# Patient Record
Sex: Male | Born: 1985
Health system: Southern US, Community
[De-identification: ages and names within clinical notes are randomized; demographics above are authoritative.]

## PROBLEM LIST (undated history)

## (undated) ENCOUNTER — Ambulatory Visit (HOSPITAL_COMMUNITY): Admission: EM | Payer: Commercial Managed Care - PPO | Source: Home / Self Care

## (undated) DIAGNOSIS — I10 Essential (primary) hypertension: Secondary | ICD-10-CM

## (undated) DIAGNOSIS — E119 Type 2 diabetes mellitus without complications: Secondary | ICD-10-CM

## (undated) DIAGNOSIS — J45909 Unspecified asthma, uncomplicated: Secondary | ICD-10-CM

## (undated) DIAGNOSIS — W3400XA Accidental discharge from unspecified firearms or gun, initial encounter: Secondary | ICD-10-CM

## (undated) DIAGNOSIS — S21339A Puncture wound without foreign body of unspecified front wall of thorax with penetration into thoracic cavity, initial encounter: Secondary | ICD-10-CM

## (undated) HISTORY — PX: CHEST SURGERY: SHX595

---

## 2004-06-13 ENCOUNTER — Emergency Department (HOSPITAL_COMMUNITY): Admission: EM | Admit: 2004-06-13 | Discharge: 2004-06-13 | Payer: Self-pay | Admitting: Emergency Medicine

## 2005-03-08 ENCOUNTER — Observation Stay (HOSPITAL_COMMUNITY): Admission: EM | Admit: 2005-03-08 | Discharge: 2005-03-08 | Payer: Self-pay | Admitting: Emergency Medicine

## 2006-05-06 ENCOUNTER — Emergency Department (HOSPITAL_COMMUNITY): Admission: EM | Admit: 2006-05-06 | Discharge: 2006-05-06 | Payer: Self-pay

## 2007-04-27 ENCOUNTER — Emergency Department (HOSPITAL_COMMUNITY): Admission: EM | Admit: 2007-04-27 | Discharge: 2007-04-27 | Payer: Self-pay | Admitting: Emergency Medicine

## 2008-01-30 ENCOUNTER — Inpatient Hospital Stay (HOSPITAL_COMMUNITY): Admission: EM | Admit: 2008-01-30 | Discharge: 2008-02-15 | Payer: Self-pay | Admitting: Family Medicine

## 2008-01-30 ENCOUNTER — Ambulatory Visit: Payer: Self-pay | Admitting: Pulmonary Disease

## 2008-02-01 ENCOUNTER — Ambulatory Visit: Payer: Self-pay | Admitting: Cardiothoracic Surgery

## 2008-02-01 ENCOUNTER — Encounter: Payer: Self-pay | Admitting: Cardiothoracic Surgery

## 2008-02-03 ENCOUNTER — Ambulatory Visit: Payer: Self-pay | Admitting: Infectious Diseases

## 2008-02-04 ENCOUNTER — Encounter: Payer: Self-pay | Admitting: Cardiothoracic Surgery

## 2008-12-25 ENCOUNTER — Emergency Department (HOSPITAL_COMMUNITY): Admission: EM | Admit: 2008-12-25 | Discharge: 2008-12-25 | Payer: Self-pay | Admitting: Family Medicine

## 2008-12-27 ENCOUNTER — Emergency Department (HOSPITAL_COMMUNITY): Admission: EM | Admit: 2008-12-27 | Discharge: 2008-12-27 | Payer: Self-pay | Admitting: Emergency Medicine

## 2009-01-17 ENCOUNTER — Emergency Department (HOSPITAL_COMMUNITY): Admission: EM | Admit: 2009-01-17 | Discharge: 2009-01-17 | Payer: Self-pay | Admitting: Family Medicine

## 2009-01-25 ENCOUNTER — Emergency Department (HOSPITAL_COMMUNITY): Admission: EM | Admit: 2009-01-25 | Discharge: 2009-01-25 | Payer: Self-pay | Admitting: Emergency Medicine

## 2009-02-15 ENCOUNTER — Emergency Department (HOSPITAL_COMMUNITY): Admission: EM | Admit: 2009-02-15 | Discharge: 2009-02-15 | Payer: Self-pay | Admitting: Emergency Medicine

## 2009-02-21 ENCOUNTER — Emergency Department (HOSPITAL_COMMUNITY): Admission: EM | Admit: 2009-02-21 | Discharge: 2009-02-22 | Payer: Self-pay | Admitting: Emergency Medicine

## 2009-05-15 ENCOUNTER — Emergency Department (HOSPITAL_COMMUNITY): Admission: EM | Admit: 2009-05-15 | Discharge: 2009-05-15 | Payer: Self-pay | Admitting: Emergency Medicine

## 2009-05-28 ENCOUNTER — Emergency Department (HOSPITAL_COMMUNITY): Admission: EM | Admit: 2009-05-28 | Discharge: 2009-05-28 | Payer: Self-pay | Admitting: Family Medicine

## 2009-08-13 IMAGING — CR DG CHEST 2V
2 series · 2 of 2 positions shown · non-contrast
Comparison: 01/17/2009

CLINICAL DATA: Back and chest pain.

CHEST - 2 VIEW

[w chest pa]
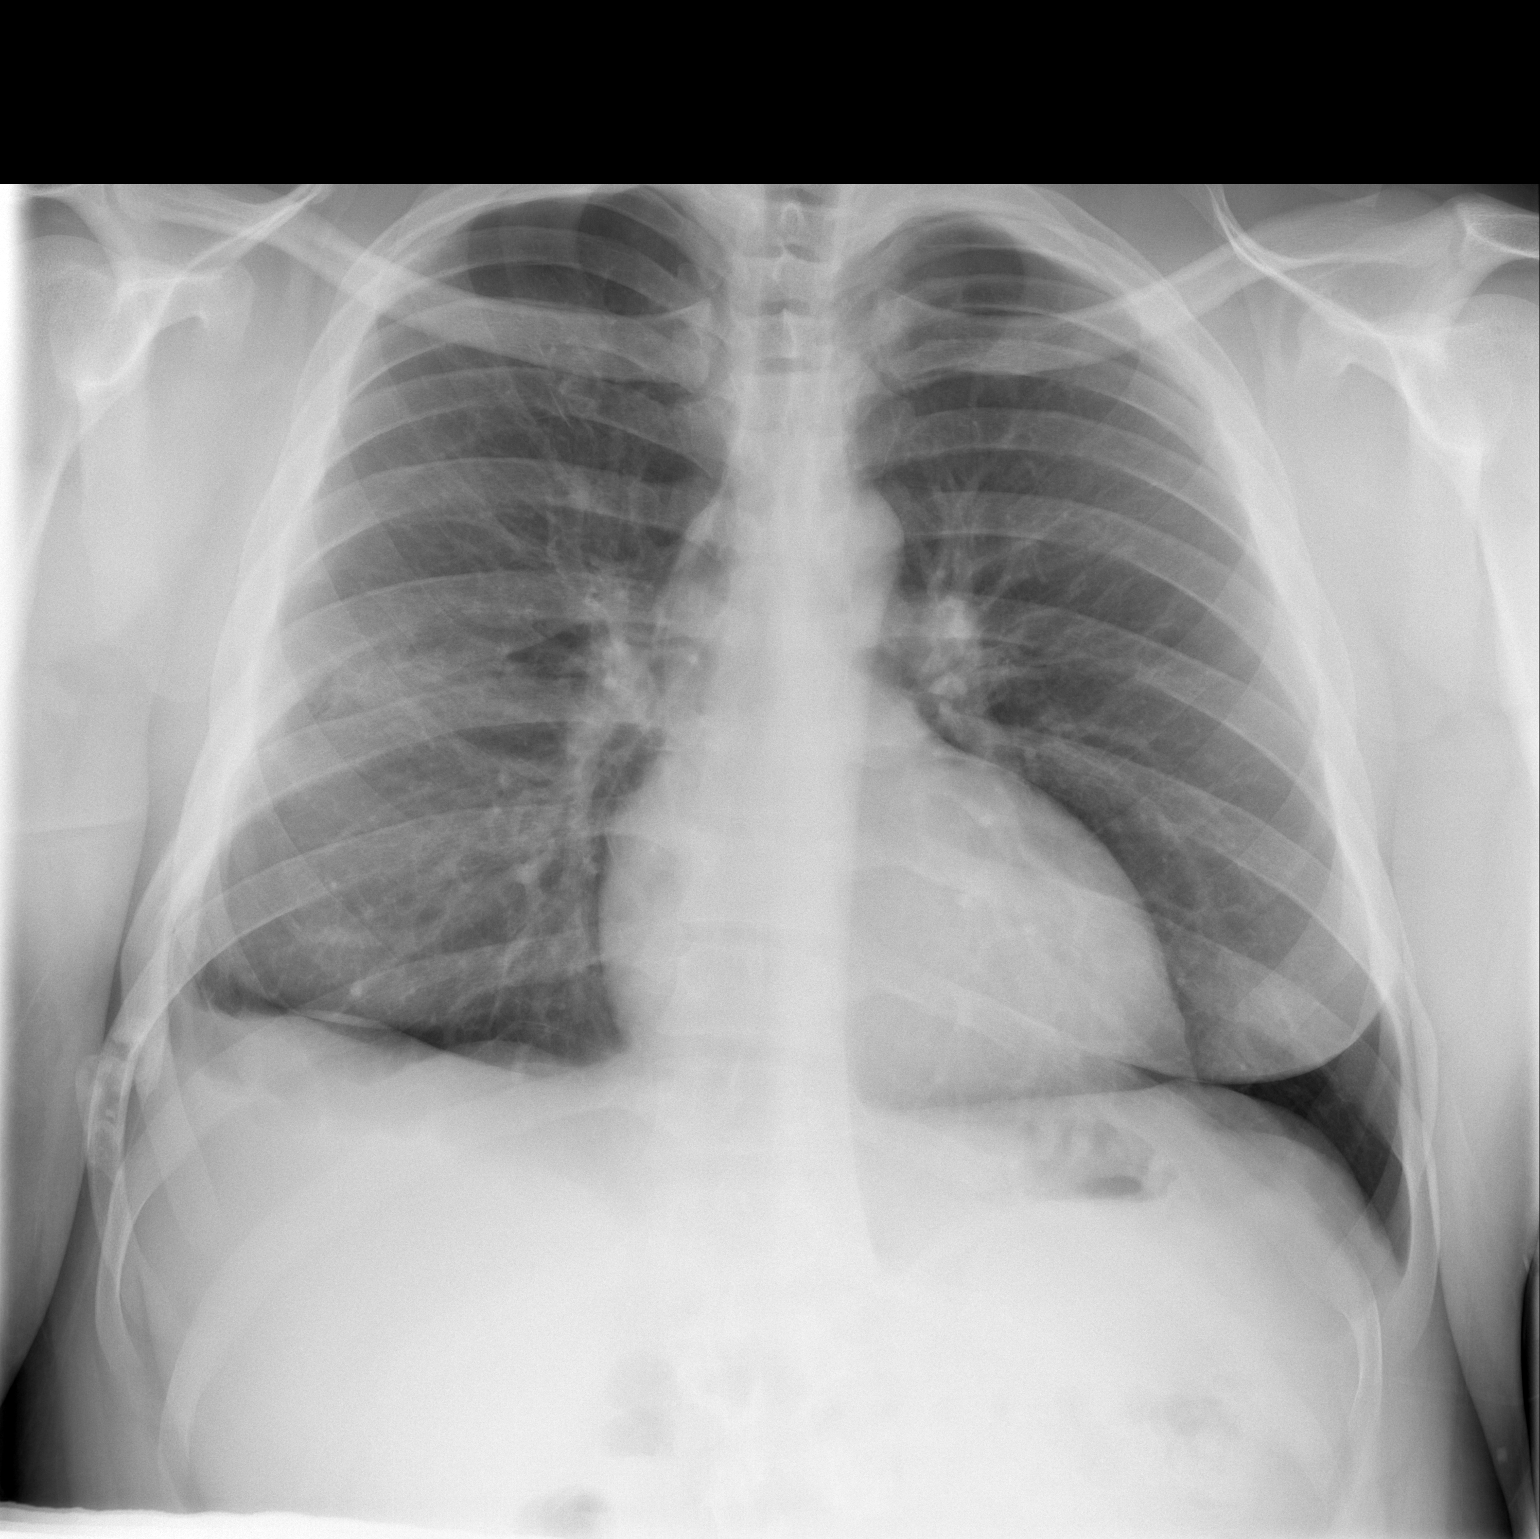

[w chest lat]
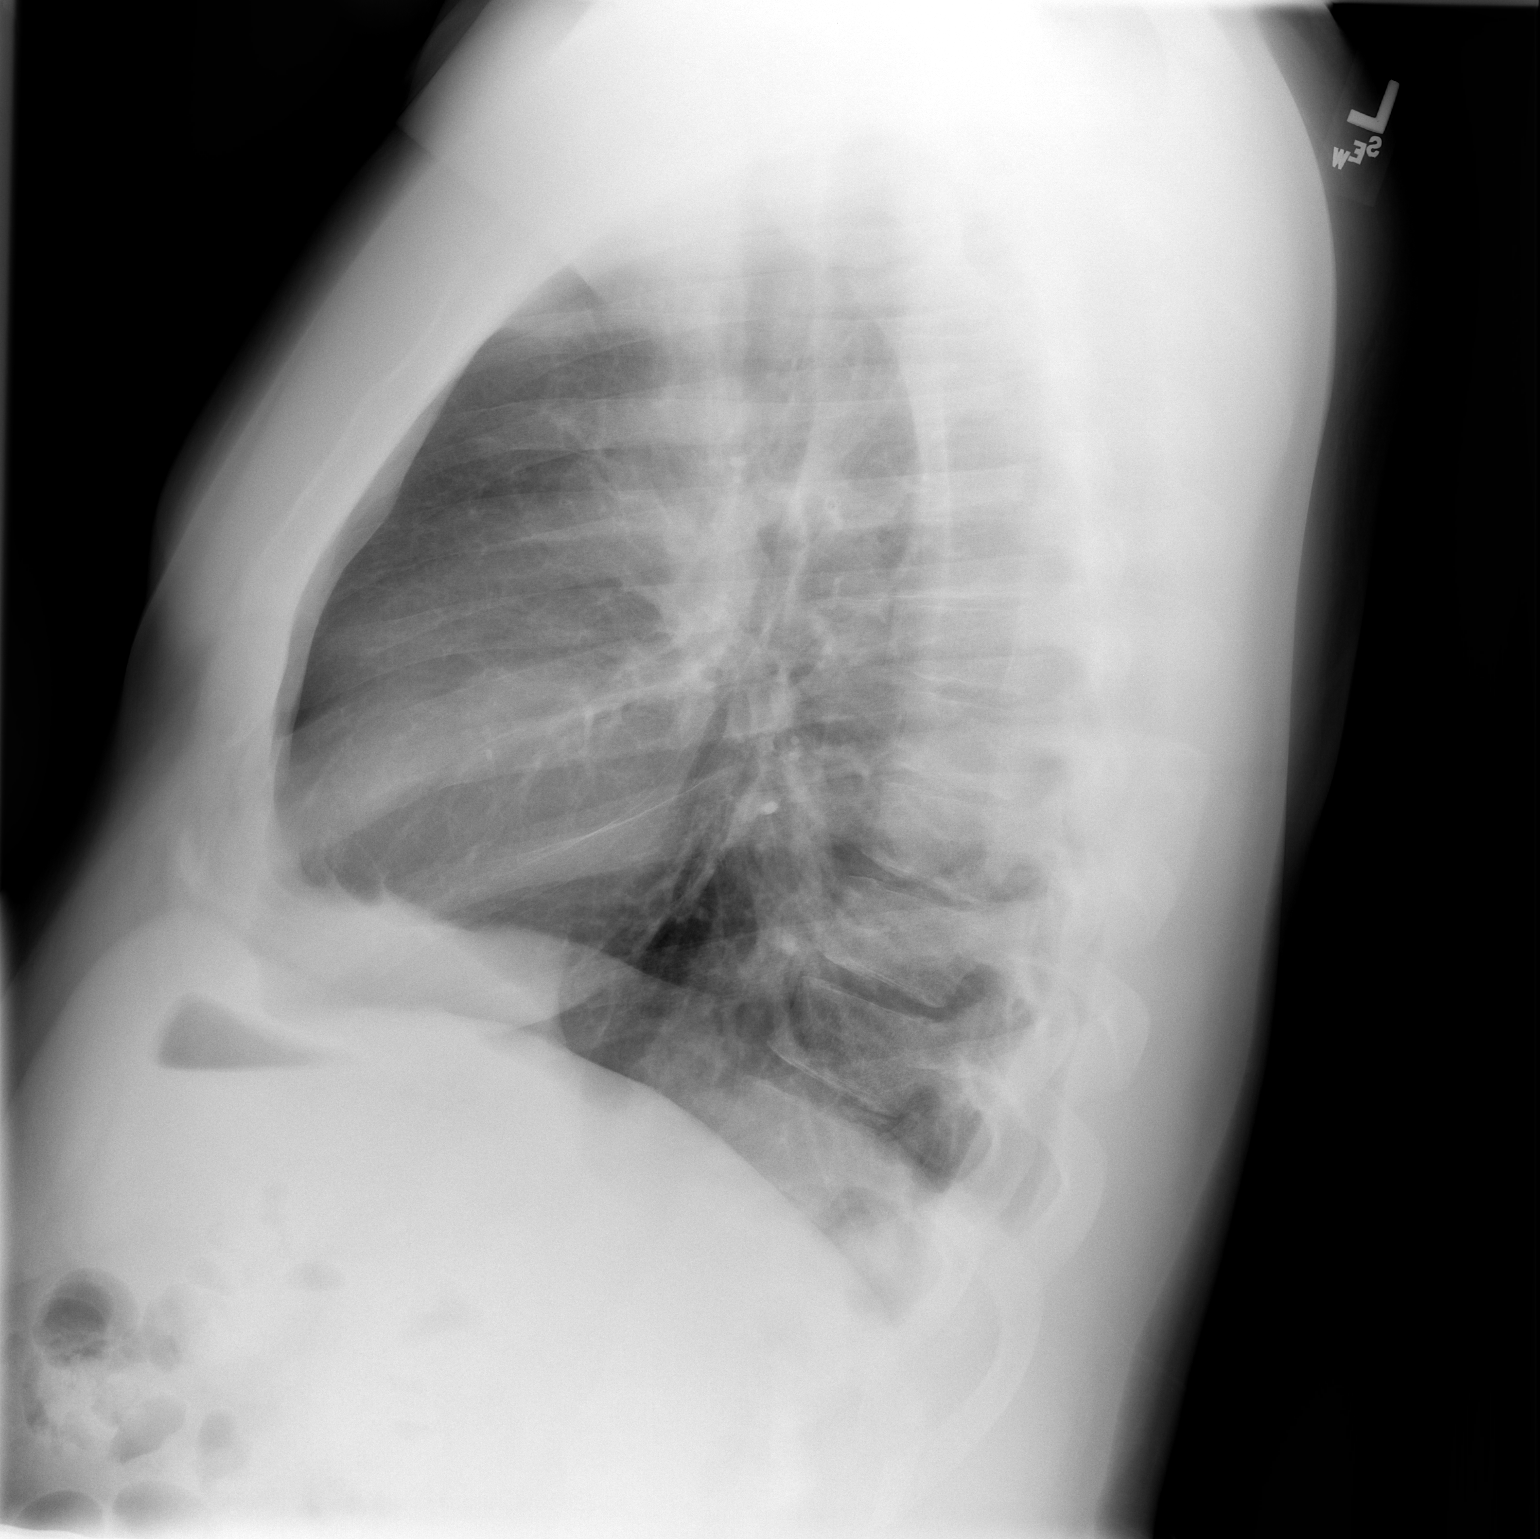

[2 of 2 positions shown; findings below may reference images not displayed]

FINDINGS: The cardiac silhouette, mediastinal and hilar contours
are within normal limits and stable.  The lungs are clear of acute
process.  There is chronic right-sided pleural thickening and
parenchymal scarring change with remote rib fractures.
IMPRESSION: Chronic pleural and parenchymal scarring change on the right.  No
acute pulmonary findings.

## 2010-07-06 ENCOUNTER — Emergency Department (HOSPITAL_COMMUNITY): Admission: EM | Admit: 2010-07-06 | Discharge: 2010-07-06 | Payer: Self-pay | Admitting: Emergency Medicine

## 2010-12-22 ENCOUNTER — Encounter: Payer: Self-pay | Admitting: Cardiothoracic Surgery

## 2011-02-14 LAB — POCT I-STAT, CHEM 8
BUN: 18 mg/dL (ref 6–23)
Calcium, Ion: 1.02 mmol/L — ABNORMAL LOW (ref 1.12–1.32)
HCT: 45 % (ref 39.0–52.0)
Sodium: 140 mEq/L (ref 135–145)
TCO2: 21 mmol/L (ref 0–100)

## 2011-03-10 LAB — POCT RAPID STREP A (OFFICE): Streptococcus, Group A Screen (Direct): NEGATIVE

## 2011-03-17 LAB — CBC
Hemoglobin: 13.5 g/dL (ref 13.0–17.0)
MCHC: 34.6 g/dL (ref 30.0–36.0)
RBC: 4.2 MIL/uL — ABNORMAL LOW (ref 4.22–5.81)
WBC: 9.6 10*3/uL (ref 4.0–10.5)

## 2011-03-17 LAB — BASIC METABOLIC PANEL
Calcium: 9.2 mg/dL (ref 8.4–10.5)
Creatinine, Ser: 1.01 mg/dL (ref 0.4–1.5)
GFR calc Af Amer: >60 mL/min (ref >60)
GFR calc non Af Amer: >60 mL/min (ref >60)
Sodium: 138 mEq/L (ref 135–145)

## 2011-03-17 LAB — POCT URINALYSIS DIP (DEVICE)
Glucose, UA: NEGATIVE mg/dL
Nitrite: NEGATIVE
Protein, ur: 30 mg/dL — AB
Specific Gravity, Urine: 1.025 (ref 1.005–1.030)
Urobilinogen, UA: 1 mg/dL (ref 0.0–1.0)

## 2011-03-17 LAB — DIFFERENTIAL
Basophils Relative: 1 % (ref 0–1)
Lymphocytes Relative: 36 % (ref 12–46)
Lymphs Abs: 3.5 10*3/uL (ref 0.7–4.0)
Monocytes Absolute: 0.6 10*3/uL (ref 0.1–1.0)
Monocytes Relative: 7 % (ref 3–12)
Neutro Abs: 5 10*3/uL (ref 1.7–7.7)
Neutrophils Relative %: 53 % (ref 43–77)

## 2011-03-17 LAB — URINALYSIS, ROUTINE W REFLEX MICROSCOPIC
Hgb urine dipstick: NEGATIVE
Specific Gravity, Urine: 1.026 (ref 1.005–1.030)
Urobilinogen, UA: 1 mg/dL (ref 0.0–1.0)
pH: 6 (ref 5.0–8.0)
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
## 2011-08-25 LAB — FUNGUS CULTURE W SMEAR: Fungal Smear: NONE SEEN

## 2011-08-25 LAB — CBC
HCT: 19.4 — ABNORMAL LOW
HCT: 21.8 — ABNORMAL LOW
HCT: 22.6 — ABNORMAL LOW
HCT: 29.7 — ABNORMAL LOW
HCT: 30 — ABNORMAL LOW
HCT: 30.8 — ABNORMAL LOW
HCT: 31.8 — ABNORMAL LOW
HCT: 32.6 — ABNORMAL LOW
HCT: 26.3 — ABNORMAL LOW
Hemoglobin: 10.3 — ABNORMAL LOW
Hemoglobin: 10.6 — ABNORMAL LOW
Hemoglobin: 11.1 — ABNORMAL LOW
Hemoglobin: 11.2 — ABNORMAL LOW
Hemoglobin: 11.5 — ABNORMAL LOW
Hemoglobin: 14
Hemoglobin: 6.5 — CL
Hemoglobin: 7.4 — CL
Hemoglobin: 7.7 — CL
Hemoglobin: 9.2 — ABNORMAL LOW
MCHC: 33.6
MCHC: 34
MCHC: 34.2
MCHC: 34.5
MCHC: 34.5
MCHC: 34.5
MCHC: 34.2
MCV: 91.2
MCV: 92
MCV: 92.8
MCV: 92.9
MCV: 92.9
MCV: 94.1
MCV: 94.3
Platelets: 264
Platelets: 327
Platelets: 337
Platelets: 339
Platelets: 361
Platelets: 372
Platelets: 544 — ABNORMAL HIGH
RBC: 2.44 — ABNORMAL LOW
RBC: 2.95 — ABNORMAL LOW
RBC: 3.35 — ABNORMAL LOW
RBC: 3.51 — ABNORMAL LOW
RBC: 3.58 — ABNORMAL LOW
RBC: 4.37
RBC: 2.9 — ABNORMAL LOW
RBC: 2.99 — ABNORMAL LOW
RDW: 13.7
RDW: 14.8
RDW: 15
RDW: 15.2
RDW: 15.9 — ABNORMAL HIGH
RDW: 15.9 — ABNORMAL HIGH
RDW: 16.1 — ABNORMAL HIGH
RDW: 15.1
WBC: 10.2
WBC: 13.2 — ABNORMAL HIGH
WBC: 13.8 — ABNORMAL HIGH
WBC: 14.6 — ABNORMAL HIGH
WBC: 15.5 — ABNORMAL HIGH
WBC: 17.1 — ABNORMAL HIGH
WBC: 17.8 — ABNORMAL HIGH
WBC: 23.7 — ABNORMAL HIGH

## 2011-08-25 LAB — BASIC METABOLIC PANEL
BUN: 15
BUN: 16
BUN: 18
BUN: 29 — ABNORMAL HIGH
BUN: 5 — ABNORMAL LOW
BUN: 7
CO2: 23
CO2: 25
CO2: 25
CO2: 26
CO2: 28
CO2: 28
CO2: 36 — ABNORMAL HIGH
Calcium: 8.3 — ABNORMAL LOW
Calcium: 8.4
Calcium: 8.7
Calcium: 8.7
Calcium: 8.8
Calcium: 8.8
Calcium: 8.9
Calcium: 9
Calcium: 9.1
Calcium: 8.4
Calcium: 8.6
Chloride: 100
Chloride: 103
Chloride: 105
Chloride: 105
Chloride: 105
Creatinine, Ser: 1.26
Creatinine, Ser: 1.36
Creatinine, Ser: 1.88 — ABNORMAL HIGH
Creatinine, Ser: 2.03 — ABNORMAL HIGH
Creatinine, Ser: 2.08 — ABNORMAL HIGH
Creatinine, Ser: 2.51 — ABNORMAL HIGH
Creatinine, Ser: 1.04
GFR calc Af Amer: 39 — ABNORMAL LOW
GFR calc Af Amer: 50 — ABNORMAL LOW
GFR calc Af Amer: 55 — ABNORMAL LOW
GFR calc Af Amer: >60
GFR calc Af Amer: >60
GFR calc Af Amer: >60
GFR calc Af Amer: >60
GFR calc Af Amer: >60
GFR calc Af Amer: >60
GFR calc non Af Amer: 41 — ABNORMAL LOW
GFR calc non Af Amer: 45 — ABNORMAL LOW
GFR calc non Af Amer: 59 — ABNORMAL LOW
GFR calc non Af Amer: >60
GFR calc non Af Amer: >60
GFR calc non Af Amer: >60
GFR calc non Af Amer: >60
GFR calc non Af Amer: >60
GFR calc non Af Amer: >60
GFR calc non Af Amer: >60
GFR calc non Af Amer: >60
GFR calc non Af Amer: >60
Glucose, Bld: 108 — ABNORMAL HIGH
Glucose, Bld: 112 — ABNORMAL HIGH
Glucose, Bld: 132 — ABNORMAL HIGH
Glucose, Bld: 148 — ABNORMAL HIGH
Glucose, Bld: 154 — ABNORMAL HIGH
Glucose, Bld: 175 — ABNORMAL HIGH
Glucose, Bld: 177 — ABNORMAL HIGH
Glucose, Bld: 186 — ABNORMAL HIGH
Glucose, Bld: 118 — ABNORMAL HIGH
Potassium: 3.3 — ABNORMAL LOW
Potassium: 3.3 — ABNORMAL LOW
Potassium: 3.4 — ABNORMAL LOW
Potassium: 3.7
Potassium: 3.8
Potassium: 3.8
Potassium: 3.8
Potassium: 3.9
Potassium: 3.9
Potassium: 3.9
Potassium: 4.2
Potassium: 2.9 — ABNORMAL LOW
Sodium: 134 — ABNORMAL LOW
Sodium: 135
Sodium: 136
Sodium: 136
Sodium: 137
Sodium: 141
Sodium: 142
Sodium: 143
Sodium: 145
Sodium: 146 — ABNORMAL HIGH
Sodium: 147 — ABNORMAL HIGH
Sodium: 135

## 2011-08-25 LAB — CULTURE, BAL-QUANTITATIVE W GRAM STAIN
Colony Count: NO GROWTH
Culture: NO GROWTH

## 2011-08-25 LAB — POCT I-STAT 3, ART BLOOD GAS (G3+)
Acid-Base Excess: 2
Acid-Base Excess: 3 — ABNORMAL HIGH
Acid-Base Excess: 5 — ABNORMAL HIGH
Bicarbonate: 27.2 — ABNORMAL HIGH
Bicarbonate: 28.8 — ABNORMAL HIGH
Bicarbonate: 29.8 — ABNORMAL HIGH
Bicarbonate: 30 — ABNORMAL HIGH
O2 Saturation: 88
O2 Saturation: 91
O2 Saturation: 95
O2 Saturation: 95
O2 Saturation: 98
O2 Saturation: 98
O2 Saturation: 99
Operator id: 280981
Operator id: 287601
Patient temperature: 102
Patient temperature: 103
Patient temperature: 98.7
TCO2: 29
TCO2: 29
TCO2: 30
TCO2: 34
TCO2: 35
pCO2 arterial: 45.4 — ABNORMAL HIGH
pCO2 arterial: 49 — ABNORMAL HIGH
pCO2 arterial: 52.1 — ABNORMAL HIGH
pCO2 arterial: 54.6 — ABNORMAL HIGH
pCO2 arterial: 59
pCO2 arterial: 59.1
pH, Arterial: 7.364
pH, Arterial: 7.366
pH, Arterial: 7.373
pO2, Arterial: 101 — ABNORMAL HIGH
pO2, Arterial: 135 — ABNORMAL HIGH
pO2, Arterial: 61 — ABNORMAL LOW

## 2011-08-25 LAB — TYPE AND SCREEN
ABO/RH(D): B POS
ABO/RH(D): B POS
Antibody Screen: NEGATIVE
Antibody Screen: NEGATIVE

## 2011-08-25 LAB — DIFFERENTIAL
Basophils Absolute: 0
Basophils Absolute: 0
Basophils Absolute: 0.1
Basophils Absolute: 0.1
Basophils Relative: 0
Basophils Relative: 0
Basophils Relative: 1
Eosinophils Absolute: 0.3
Eosinophils Absolute: 0.4
Eosinophils Absolute: 0.6
Eosinophils Relative: 1
Eosinophils Relative: 2
Eosinophils Relative: 2
Eosinophils Relative: 5
Lymphocytes Relative: 11 — ABNORMAL LOW
Lymphocytes Relative: 11 — ABNORMAL LOW
Lymphocytes Relative: 12
Lymphocytes Relative: 19
Lymphs Abs: 1.6
Lymphs Abs: 1.8
Lymphs Abs: 1.9
Monocytes Absolute: 0.6
Monocytes Absolute: 0.7
Monocytes Absolute: 1.4 — ABNORMAL HIGH
Monocytes Absolute: 1.5 — ABNORMAL HIGH
Monocytes Absolute: 1.5 — ABNORMAL HIGH
Monocytes Relative: 11
Monocytes Relative: 4
Monocytes Relative: 6
Neutro Abs: 12.5 — ABNORMAL HIGH
Neutro Abs: 16.1 — ABNORMAL HIGH
Neutro Abs: 20.6 — ABNORMAL HIGH
Neutro Abs: 7.2
Neutrophils Relative %: 80 — ABNORMAL HIGH
Neutrophils Relative %: 80 — ABNORMAL HIGH
Neutrophils Relative %: 87 — ABNORMAL HIGH

## 2011-08-25 LAB — COMPREHENSIVE METABOLIC PANEL
ALT: 108 — ABNORMAL HIGH
AST: 232 — ABNORMAL HIGH
AST: 77 — ABNORMAL HIGH
Albumin: 1.5 — ABNORMAL LOW
Alkaline Phosphatase: 116
Calcium: 8.2 — ABNORMAL LOW
Calcium: 8.4
Creatinine, Ser: 1.51 — ABNORMAL HIGH
GFR calc Af Amer: 41 — ABNORMAL LOW
GFR calc Af Amer: >60
GFR calc non Af Amer: 58 — ABNORMAL LOW
Glucose, Bld: 180 — ABNORMAL HIGH
Potassium: 4.5
Sodium: 143
Total Protein: 6
Total Protein: 6.5

## 2011-08-25 LAB — POTASSIUM
Potassium: 3.5
Potassium: 3.6

## 2011-08-25 LAB — CROSSMATCH: Antibody Screen: NEGATIVE

## 2011-08-25 LAB — APTT
aPTT: 38 — ABNORMAL HIGH
aPTT: 42 — ABNORMAL HIGH

## 2011-08-25 LAB — PHOSPHORUS
Phosphorus: 3.9
Phosphorus: 4.3
Phosphorus: 6 — ABNORMAL HIGH

## 2011-08-25 LAB — TSH: TSH: 0.658

## 2011-08-25 LAB — URINALYSIS, ROUTINE W REFLEX MICROSCOPIC
Bilirubin Urine: NEGATIVE
Hgb urine dipstick: NEGATIVE
Ketones, ur: NEGATIVE
Nitrite: NEGATIVE
Protein, ur: 30 — AB
Urobilinogen, UA: 1

## 2011-08-25 LAB — SAMPLE TO BLOOD BANK

## 2011-08-25 LAB — PROTIME-INR
INR: 1.1
INR: 1.1
Prothrombin Time: 14.7

## 2011-08-25 LAB — POCT I-STAT 7, (LYTES, BLD GAS, ICA,H+H)
Bicarbonate: 31.5 — ABNORMAL HIGH
O2 Saturation: 98
Patient temperature: 104.3
Potassium: 3.8
TCO2: 33
pCO2 arterial: 66.9
pO2, Arterial: 139 — ABNORMAL HIGH

## 2011-08-25 LAB — PREPARE RBC (CROSSMATCH)

## 2011-08-25 LAB — CORTISOL: Cortisol, Plasma: 19.8

## 2011-08-25 LAB — LIPID PANEL
HDL: 12 — ABNORMAL LOW
Triglycerides: 323 — ABNORMAL HIGH
VLDL: 65 — ABNORMAL HIGH

## 2011-08-25 LAB — BLOOD GAS, ARTERIAL
Bicarbonate: 25.5 — ABNORMAL HIGH
FIO2: 0.5
O2 Saturation: 98
pCO2 arterial: 38.6
pO2, Arterial: 99.5

## 2011-08-25 LAB — AMYLASE: Amylase: 181 — ABNORMAL HIGH

## 2011-08-25 LAB — URINE CULTURE

## 2011-08-25 LAB — AFB CULTURE WITH SMEAR (NOT AT ARMC)

## 2011-08-25 LAB — HEPATITIS C ANTIBODY: HCV Ab: NEGATIVE

## 2011-08-25 LAB — OSMOLALITY, URINE: Osmolality, Ur: 568

## 2011-08-25 LAB — BODY FLUID CULTURE

## 2011-08-25 LAB — HEPATITIS B SURFACE ANTIGEN: Hepatitis B Surface Ag: NEGATIVE

## 2011-08-25 LAB — CARDIAC PANEL(CRET KIN+CKTOT+MB+TROPI)
CK, MB: 5.7 — ABNORMAL HIGH
Relative Index: 1.8
Total CK: 324 — ABNORMAL HIGH
Troponin I: 0.38 — ABNORMAL HIGH

## 2011-08-25 LAB — TISSUE CULTURE: Gram Stain: NONE SEEN

## 2011-08-25 LAB — CARBOXYHEMOGLOBIN
Carboxyhemoglobin: 1.6 — ABNORMAL HIGH
Carboxyhemoglobin: 1.6 — ABNORMAL HIGH
Methemoglobin: 0.7

## 2011-08-25 LAB — HEPATITIS E ANTIBODY, IGG: Hepatitis E IgG Abs: NONREACTIVE

## 2011-08-25 LAB — LIPASE, BLOOD: Lipase: 60 — ABNORMAL HIGH

## 2011-08-25 LAB — MAGNESIUM
Magnesium: 1.6
Magnesium: 1.7
Magnesium: 2.5
Magnesium: 2.9 — ABNORMAL HIGH

## 2011-08-25 LAB — CULTURE, BLOOD (ROUTINE X 2)

## 2011-08-25 LAB — CK TOTAL AND CKMB (NOT AT ARMC)
CK, MB: 2.4
CK, MB: 2.9
Relative Index: 0.9
Relative Index: 1.1
Total CK: 224
Total CK: 311 — ABNORMAL HIGH

## 2011-08-25 LAB — HEPATITIS B SURFACE ANTIBODY,QUALITATIVE: Hep B S Ab: POSITIVE — AB

## 2011-08-25 LAB — BASIC METABOLIC PANEL WITH GFR
Calcium: 9.3
Chloride: 99
Creatinine, Ser: 2.84 — ABNORMAL HIGH
GFR calc Af Amer: 34 — ABNORMAL LOW
GFR calc non Af Amer: 28 — ABNORMAL LOW

## 2011-08-25 LAB — URINE MICROSCOPIC-ADD ON

## 2011-08-25 LAB — B-NATRIURETIC PEPTIDE (CONVERTED LAB): Pro B Natriuretic peptide (BNP): 378 — ABNORMAL HIGH

## 2011-08-25 LAB — ANAEROBIC CULTURE: Gram Stain: NONE SEEN

## 2011-08-25 LAB — HEMOGLOBIN A1C: Mean Plasma Glucose: 133

## 2011-08-25 LAB — LACTIC ACID, PLASMA: Lactic Acid, Venous: 1.4

## 2011-08-25 LAB — ABO/RH: ABO/RH(D): B POS

## 2011-08-25 LAB — VANCOMYCIN, TROUGH: Vancomycin Tr: 6.5

## 2011-08-25 LAB — LACTATE DEHYDROGENASE: LDH: 186

## 2011-10-17 ENCOUNTER — Emergency Department (INDEPENDENT_AMBULATORY_CARE_PROVIDER_SITE_OTHER)
Admission: EM | Admit: 2011-10-17 | Discharge: 2011-10-17 | Disposition: A | Discharge status: Home / Self Care | Payer: Self-pay | Source: Home / Self Care | Attending: Family Medicine | Admitting: Family Medicine

## 2011-10-17 DIAGNOSIS — R109 Unspecified abdominal pain: Secondary | ICD-10-CM

## 2011-10-17 DIAGNOSIS — G8929 Other chronic pain: Secondary | ICD-10-CM

## 2011-10-17 DIAGNOSIS — M549 Dorsalgia, unspecified: Secondary | ICD-10-CM

## 2011-10-17 HISTORY — DX: Puncture wound without foreign body of unspecified front wall of thorax with penetration into thoracic cavity, initial encounter: S21.339A

## 2011-10-17 HISTORY — DX: Accidental discharge from unspecified firearms or gun, initial encounter: W34.00XA

## 2011-10-17 HISTORY — DX: Essential (primary) hypertension: I10

## 2011-10-17 MED ORDER — OXYCODONE-ACETAMINOPHEN 5-325 MG PO TABS: ORAL_TABLET | ORAL | Status: AC

## 2011-10-17 NOTE — ED Notes (Signed)
Pt c/o ongoing headaches for "a long time" but worse the last week.  Also c/o lt upper and mid back pain for years.  Additionally having upper abdominal pain- states he did vomit a couple of days ago.  Also concerned because he coughed up blood 2 days ago.  Denies diarrhea.  States he took his uncles oxycodone and that helped his pain.
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
## 2012-07-06 ENCOUNTER — Emergency Department (HOSPITAL_COMMUNITY): Payer: Self-pay

## 2012-07-06 ENCOUNTER — Emergency Department (HOSPITAL_COMMUNITY)
Admission: EM | Admit: 2012-07-06 | Discharge: 2012-07-06 | Disposition: A | Discharge status: Home / Self Care | Payer: Self-pay | Attending: Emergency Medicine | Admitting: Emergency Medicine

## 2012-07-06 ENCOUNTER — Encounter (HOSPITAL_COMMUNITY): Payer: Self-pay

## 2012-07-06 ENCOUNTER — Other Ambulatory Visit: Payer: Self-pay

## 2012-07-06 DIAGNOSIS — I10 Essential (primary) hypertension: Secondary | ICD-10-CM | POA: Insufficient documentation

## 2012-07-06 DIAGNOSIS — R0602 Shortness of breath: Secondary | ICD-10-CM | POA: Insufficient documentation

## 2012-07-06 DIAGNOSIS — E119 Type 2 diabetes mellitus without complications: Secondary | ICD-10-CM | POA: Insufficient documentation

## 2012-07-06 DIAGNOSIS — F17200 Nicotine dependence, unspecified, uncomplicated: Secondary | ICD-10-CM | POA: Insufficient documentation

## 2012-07-06 LAB — COMPREHENSIVE METABOLIC PANEL
ALT: 21 U/L (ref 0–53)
AST: 21 U/L (ref 0–37)
Albumin: 4.1 g/dL (ref 3.5–5.2)
CO2: 29 mEq/L (ref 19–32)
Calcium: 10 mg/dL (ref 8.4–10.5)
Chloride: 98 mEq/L (ref 96–112)
Creatinine, Ser: 0.89 mg/dL (ref 0.50–1.35)
Sodium: 138 mEq/L (ref 135–145)

## 2012-07-06 LAB — CBC WITH DIFFERENTIAL/PLATELET
Basophils Absolute: 0 10*3/uL (ref 0.0–0.1)
Basophils Relative: 1 % (ref 0–1)
HCT: 46.9 % (ref 39.0–52.0)
Lymphocytes Relative: 28 % (ref 12–46)
MCHC: 34.3 g/dL (ref 30.0–36.0)
Monocytes Absolute: 0.3 10*3/uL (ref 0.1–1.0)
Neutro Abs: 3.4 10*3/uL (ref 1.7–7.7)
Platelets: 270 10*3/uL (ref 150–400)
RDW: 12.6 % (ref 11.5–15.5)
WBC: 5.4 10*3/uL (ref 4.0–10.5)

## 2012-07-06 MED ORDER — LISINOPRIL-HYDROCHLOROTHIAZIDE 20-12.5 MG PO TABS: 1.0000 | ORAL_TABLET | Freq: Every day | ORAL | Status: DC

## 2012-07-06 MED ORDER — METFORMIN HCL 500 MG PO TABS: 500.0000 mg | ORAL_TABLET | Freq: Two times a day (BID) | ORAL | Status: DC
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
## 2012-07-06 NOTE — ED Notes (Signed)
Pt sts feels sob and trouble breathing, recently incarcerated and was receiving his meds, now nto taking them and symptoms falred up.

## 2012-07-06 NOTE — ED Notes (Signed)
Pt in NAD, no SOB noted at this time.

## 2014-02-04 ENCOUNTER — Emergency Department (INDEPENDENT_AMBULATORY_CARE_PROVIDER_SITE_OTHER): Payer: Self-pay

## 2014-02-04 ENCOUNTER — Emergency Department (INDEPENDENT_AMBULATORY_CARE_PROVIDER_SITE_OTHER)
Admission: EM | Admit: 2014-02-04 | Discharge: 2014-02-04 | Disposition: A | Discharge status: Home / Self Care | Payer: Self-pay | Source: Home / Self Care | Attending: Family Medicine | Admitting: Family Medicine

## 2014-02-04 ENCOUNTER — Encounter (HOSPITAL_COMMUNITY): Payer: Self-pay | Admitting: Emergency Medicine

## 2014-02-04 DIAGNOSIS — L02419 Cutaneous abscess of limb, unspecified: Secondary | ICD-10-CM

## 2014-02-04 DIAGNOSIS — L03119 Cellulitis of unspecified part of limb: Secondary | ICD-10-CM

## 2014-02-04 DIAGNOSIS — L03115 Cellulitis of right lower limb: Secondary | ICD-10-CM

## 2014-02-04 MED ORDER — CEPHALEXIN 500 MG PO CAPS: 500.0000 mg | ORAL_CAPSULE | Freq: Three times a day (TID) | ORAL | Status: DC
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
## 2014-02-04 NOTE — Discharge Instructions (Signed)
Warm soaks as discussed. Take the medication as directed. If the infection has worsened after 48 hours please return here for a wound recheck.   Cellulitis Cellulitis is an infection of the skin and the tissue under the skin. The infected area is usually red and tender. This happens most often in the arms and lower legs. HOME CARE   Take your antibiotic medicine as told. Finish the medicine even if you start to feel better.  Keep the infected arm or leg raised (elevated).  Put a warm cloth on the area up to 4 times per day.  Only take medicines as told by your doctor.  Keep all doctor visits as told. GET HELP RIGHT AWAY IF:   You have a fever.  You feel very sleepy.  You throw up (vomit) or have watery poop (diarrhea).  You feel sick and have muscle aches and pains.  You see red streaks on the skin coming from the infected area.  Your red area gets bigger or turns a dark color.  Your bone or joint under the infected area is painful after the skin heals.  Your infection comes back in the same area or different area.  You have a puffy (swollen) bump in the infected area.  You have new symptoms. MAKE SURE YOU:   Understand these instructions.  Will watch your condition.  Will get help right away if you are not doing well or get worse. Document Released: 05/05/2008 Document Revised: 05/18/2012 Document Reviewed: 02/02/2012 [facility] LLCExitCare Patient Information 2014 [place], [place].
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
## 2014-02-06 NOTE — ED Provider Notes (Signed)
Medical screening examination/treatment/procedure(s) were performed by resident physician or non-physician practitioner and as supervising physician I was immediately available for consultation/collaboration.   Imoni Kohen DOUGLAS MD.   Charnice Zwilling D Kelcee Bjorn, MD 02/06/14 2055 

## 2014-02-07 ENCOUNTER — Encounter (HOSPITAL_COMMUNITY): Payer: Self-pay | Admitting: Emergency Medicine

## 2014-02-07 ENCOUNTER — Emergency Department (HOSPITAL_COMMUNITY)
Admission: EM | Admit: 2014-02-07 | Discharge: 2014-02-08 | Disposition: A | Discharge status: Home / Self Care | Payer: Self-pay | Attending: Emergency Medicine | Admitting: Emergency Medicine

## 2014-02-07 DIAGNOSIS — F17200 Nicotine dependence, unspecified, uncomplicated: Secondary | ICD-10-CM | POA: Insufficient documentation

## 2014-02-07 DIAGNOSIS — L03119 Cellulitis of unspecified part of limb: Principal | ICD-10-CM

## 2014-02-07 DIAGNOSIS — R10819 Abdominal tenderness, unspecified site: Secondary | ICD-10-CM | POA: Insufficient documentation

## 2014-02-07 DIAGNOSIS — Z79899 Other long term (current) drug therapy: Secondary | ICD-10-CM | POA: Insufficient documentation

## 2014-02-07 DIAGNOSIS — I10 Essential (primary) hypertension: Secondary | ICD-10-CM | POA: Insufficient documentation

## 2014-02-07 DIAGNOSIS — Z87828 Personal history of other (healed) physical injury and trauma: Secondary | ICD-10-CM | POA: Insufficient documentation

## 2014-02-07 DIAGNOSIS — L02419 Cutaneous abscess of limb, unspecified: Secondary | ICD-10-CM | POA: Insufficient documentation

## 2014-02-07 MED ORDER — HYDROCODONE-ACETAMINOPHEN 5-325 MG PO TABS
1.0000 | ORAL_TABLET | Freq: Once | ORAL | Status: AC
  Administered 2014-02-08: 1 via ORAL
  Filled 2014-02-07: qty 1

## 2014-02-07 MED ORDER — CLINDAMYCIN HCL 300 MG PO CAPS
300.0000 mg | ORAL_CAPSULE | Freq: Once | ORAL | Status: AC
  Administered 2014-02-08: 300 mg via ORAL
  Filled 2014-02-07: qty 1

## 2014-02-07 NOTE — ED Notes (Signed)
[facility]. sts was told to come back if not better. sts he think his RLE is worse. RLE is very red, swollen and huge knot present. Pt sts it is very painful.
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
## 2014-02-07 NOTE — ED Notes (Signed)
Dr. Ranae PalmsYelverton discussed draining of right leg. He will order Norco and clindamycin PO.

## 2014-02-07 NOTE — ED Notes (Signed)
Dr. Yelverton at the bedside.  

## 2014-02-08 MED ORDER — CLINDAMYCIN HCL 300 MG PO CAPS
300.0000 mg | ORAL_CAPSULE | Freq: Four times a day (QID) | ORAL | Status: DC
Start: 2014-02-07 — End: 2017-09-06

## 2014-02-08 MED ORDER — HYDROCODONE-ACETAMINOPHEN 5-325 MG PO TABS: 1.0000 | ORAL_TABLET | ORAL | Status: DC | PRN

## 2014-02-08 NOTE — Discharge Instructions (Signed)
Abscess °An abscess is an infected area that contains a collection of pus and debris. It can occur in almost any part of the body. An abscess is also known as a furuncle or boil. °CAUSES  °An abscess occurs when tissue gets infected. This can occur from blockage of oil or sweat glands, infection of hair follicles, or a minor injury to the skin. As the body tries to fight the infection, pus collects in the area and creates pressure under the skin. This pressure causes pain. People with weakened immune systems have difficulty fighting infections and get certain abscesses more often.  °SYMPTOMS °Usually an abscess develops on the skin and becomes a painful mass that is red, warm, and tender. If the abscess forms under the skin, you may feel a moveable soft area under the skin. Some abscesses break open (rupture) on their own, but most will continue to get worse without care. The infection can spread deeper into the body and eventually into the bloodstream, causing you to feel ill.  °DIAGNOSIS  °Your caregiver will take your medical history and perform a physical exam. A sample of fluid may also be taken from the abscess to determine what is causing your infection. °TREATMENT  °Your caregiver may prescribe antibiotic medicines to fight the infection. However, taking antibiotics alone usually does not cure an abscess. Your caregiver may need to make a small cut (incision) in the abscess to drain the pus. In some cases, gauze is packed into the abscess to reduce pain and to continue draining the area. °HOME CARE INSTRUCTIONS  °· Only take over-the-counter or prescription medicines for pain, discomfort, or fever as directed by your caregiver. °· If you were prescribed antibiotics, take them as directed. Finish them even if you start to feel better. °· If gauze is used, follow your caregiver's directions for changing the gauze. °· To avoid spreading the infection: °· Keep your draining abscess covered with a  bandage. °· Wash your hands well. °· Do not share personal care items, towels, or whirlpools with others. °· Avoid skin contact with others. °· Keep your skin and clothes clean around the abscess. °· Keep all follow-up appointments as directed by your caregiver. °SEEK MEDICAL CARE IF:  °· You have increased pain, swelling, redness, fluid drainage, or bleeding. °· You have muscle aches, chills, or a general ill feeling. °· You have a fever. °MAKE SURE YOU:  °· Understand these instructions. °· Will watch your condition. °· Will get help right away if you are not doing well or get worse. °Document Released: 08/27/2005 Document Revised: 05/18/2012 Document Reviewed: 01/30/2012 °Exit[employer]® Patient Information ©2014 [employer]. ° °Cellulitis °Cellulitis is an infection of the skin and the tissue beneath it. The infected area is usually red and tender. Cellulitis occurs most often in the arms and lower legs.  °CAUSES  °Cellulitis is caused by bacteria that enter the skin through cracks or cuts in the skin. The most common types of bacteria that cause cellulitis are Staphylococcus and Streptococcus. °SYMPTOMS  °· Redness and warmth. °· Swelling. °· Tenderness or pain. °· Fever. °DIAGNOSIS  °Your caregiver can usually determine what is wrong based on a physical exam. Blood tests may also be done. °TREATMENT  °Treatment usually involves taking an antibiotic medicine. °HOME CARE INSTRUCTIONS  °· Take your antibiotics as directed. Finish them even if you start to feel better. °· Keep the infected arm or leg elevated to reduce swelling. °· Apply a warm cloth to the affected area up to   4 times per day to relieve pain. °· Only take over-the-counter or prescription medicines for pain, discomfort, or fever as directed by your caregiver. °· Keep all follow-up appointments as directed by your caregiver. °SEEK MEDICAL CARE IF:  °· You notice red streaks coming from the infected area. °· Your red area gets larger or turns dark in  color. °· Your bone or joint underneath the infected area becomes painful after the skin has healed. °· Your infection returns in the same area or another area. °· You notice a swollen bump in the infected area. °· You develop new symptoms. °SEEK IMMEDIATE MEDICAL CARE IF:  °· You have a fever. °· You feel very sleepy. °· You develop vomiting or diarrhea. °· You have a general ill feeling (malaise) with muscle aches and pains. °MAKE SURE YOU:  °· Understand these instructions. °· Will watch your condition. °· Will get help right away if you are not doing well or get worse. °Document Released: 08/27/2005 Document Revised: 05/18/2012 Document Reviewed: 02/02/2012 °Exit[employer]® Patient Information ©2014 [employer]. ° °

## 2014-02-08 NOTE — ED Notes (Signed)
Patient has ride home with girlfriend 

## 2014-02-08 NOTE — ED Notes (Signed)
Contacted pharmacy for clindamycin PO.

## 2015-01-31 ENCOUNTER — Emergency Department (INDEPENDENT_AMBULATORY_CARE_PROVIDER_SITE_OTHER)
Admission: EM | Admit: 2015-01-31 | Discharge: 2015-01-31 | Disposition: A | Discharge status: Home / Self Care | Payer: Self-pay | Source: Home / Self Care | Attending: Family Medicine | Admitting: Family Medicine

## 2015-01-31 ENCOUNTER — Encounter (HOSPITAL_COMMUNITY): Payer: Self-pay | Admitting: Family Medicine

## 2015-01-31 ENCOUNTER — Emergency Department (INDEPENDENT_AMBULATORY_CARE_PROVIDER_SITE_OTHER): Payer: Self-pay

## 2015-01-31 DIAGNOSIS — J9801 Acute bronchospasm: Secondary | ICD-10-CM

## 2015-01-31 DIAGNOSIS — J189 Pneumonia, unspecified organism: Secondary | ICD-10-CM

## 2015-01-31 DIAGNOSIS — I10 Essential (primary) hypertension: Secondary | ICD-10-CM

## 2015-01-31 MED ORDER — METHYLPREDNISOLONE SODIUM SUCC 125 MG IJ SOLR
INTRAMUSCULAR | Status: AC
Start: 2015-01-31 — End: 2015-01-31
  Filled 2015-01-31: qty 2

## 2015-01-31 MED ORDER — METHYLPREDNISOLONE SODIUM SUCC 125 MG IJ SOLR
125.0000 mg | Freq: Once | INTRAMUSCULAR | Status: AC
  Administered 2015-01-31: 125 mg via INTRAMUSCULAR

## 2015-01-31 MED ORDER — AZITHROMYCIN 250 MG PO TABS: 250.0000 mg | ORAL_TABLET | Freq: Every day | ORAL | Status: DC

## 2015-01-31 MED ORDER — CEFTRIAXONE SODIUM 1 G IJ SOLR
INTRAMUSCULAR | Status: AC
  Filled 2015-01-31: qty 10

## 2015-01-31 MED ORDER — SPACER/AERO-HOLDING CHAMBERS DEVI: Status: DC

## 2015-01-31 MED ORDER — LIDOCAINE HCL (PF) 1 % IJ SOLN
INTRAMUSCULAR | Status: AC
  Filled 2015-01-31: qty 5

## 2015-01-31 MED ORDER — ALBUTEROL SULFATE HFA 108 (90 BASE) MCG/ACT IN AERS: 2.0000 | INHALATION_SPRAY | Freq: Four times a day (QID) | RESPIRATORY_TRACT | Status: DC | PRN

## 2015-01-31 MED ORDER — IPRATROPIUM-ALBUTEROL 0.5-2.5 (3) MG/3ML IN SOLN
3.0000 mL | Freq: Once | RESPIRATORY_TRACT | Status: AC
  Administered 2015-01-31: 3 mL via RESPIRATORY_TRACT

## 2015-01-31 MED ORDER — PREDNISONE 50 MG PO TABS: ORAL_TABLET | ORAL | Status: DC

## 2015-01-31 MED ORDER — CEFTRIAXONE SODIUM 1 G IJ SOLR
1.0000 g | Freq: Once | INTRAMUSCULAR | Status: AC
  Administered 2015-01-31: 1 g via INTRAMUSCULAR

## 2015-01-31 MED ORDER — IPRATROPIUM-ALBUTEROL 0.5-2.5 (3) MG/3ML IN SOLN
RESPIRATORY_TRACT | Status: AC
  Filled 2015-01-31: qty 3

## 2015-01-31 NOTE — ED Notes (Signed)
C/o cold sx onset 1 week Sx include: nauseas, HA, hot flashes, abd pain, vomiting Denies fevers, chills Taking OTC theraflu Alert, no signs of acute distress.
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
## 2015-01-31 NOTE — Discharge Instructions (Signed)
You have pneumonia. Please take antibiotics as prescribed. Please also take the steroids for the next 5 days to help out with wheezing and chest tightness. Please use the albuterol inhaler every 4 hours for the next 24-48 hours. Please use Tylenol and ibuprofen for fevers and general pain relief.

## 2016-03-20 ENCOUNTER — Emergency Department (HOSPITAL_COMMUNITY)
Admission: EM | Admit: 2016-03-20 | Discharge: 2016-03-20 | Disposition: A | Discharge status: Home / Self Care | Payer: No Typology Code available for payment source | Attending: Emergency Medicine | Admitting: Emergency Medicine

## 2016-03-20 ENCOUNTER — Encounter (HOSPITAL_COMMUNITY): Payer: Self-pay

## 2016-03-20 DIAGNOSIS — Z792 Long term (current) use of antibiotics: Secondary | ICD-10-CM | POA: Diagnosis not present

## 2016-03-20 DIAGNOSIS — M545 Low back pain: Secondary | ICD-10-CM | POA: Diagnosis present

## 2016-03-20 DIAGNOSIS — Y92410 Unspecified street and highway as the place of occurrence of the external cause: Secondary | ICD-10-CM | POA: Insufficient documentation

## 2016-03-20 DIAGNOSIS — M542 Cervicalgia: Secondary | ICD-10-CM | POA: Insufficient documentation

## 2016-03-20 DIAGNOSIS — I10 Essential (primary) hypertension: Secondary | ICD-10-CM | POA: Insufficient documentation

## 2016-03-20 DIAGNOSIS — Z791 Long term (current) use of non-steroidal anti-inflammatories (NSAID): Secondary | ICD-10-CM | POA: Diagnosis not present

## 2016-03-20 DIAGNOSIS — Z7952 Long term (current) use of systemic steroids: Secondary | ICD-10-CM | POA: Diagnosis not present

## 2016-03-20 DIAGNOSIS — Y999 Unspecified external cause status: Secondary | ICD-10-CM | POA: Insufficient documentation

## 2016-03-20 DIAGNOSIS — Z7984 Long term (current) use of oral hypoglycemic drugs: Secondary | ICD-10-CM | POA: Insufficient documentation

## 2016-03-20 DIAGNOSIS — Z7951 Long term (current) use of inhaled steroids: Secondary | ICD-10-CM | POA: Diagnosis not present

## 2016-03-20 DIAGNOSIS — Y939 Activity, unspecified: Secondary | ICD-10-CM | POA: Diagnosis not present

## 2016-03-20 DIAGNOSIS — F17210 Nicotine dependence, cigarettes, uncomplicated: Secondary | ICD-10-CM | POA: Insufficient documentation

## 2016-03-20 DIAGNOSIS — Z79899 Other long term (current) drug therapy: Secondary | ICD-10-CM | POA: Insufficient documentation

## 2016-03-20 DIAGNOSIS — Z79891 Long term (current) use of opiate analgesic: Secondary | ICD-10-CM | POA: Insufficient documentation

## 2016-03-20 MED ORDER — METHOCARBAMOL 500 MG PO TABS: 500.0000 mg | ORAL_TABLET | Freq: Two times a day (BID) | ORAL | Status: DC

## 2016-03-20 MED ORDER — IBUPROFEN 800 MG PO TABS: 800.0000 mg | ORAL_TABLET | Freq: Three times a day (TID) | ORAL | Status: DC
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
## 2016-03-20 NOTE — Discharge Instructions (Signed)
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
## 2016-03-22 ENCOUNTER — Encounter (HOSPITAL_COMMUNITY): Payer: Self-pay | Admitting: *Deleted

## 2016-03-22 ENCOUNTER — Emergency Department (HOSPITAL_COMMUNITY): Payer: No Typology Code available for payment source

## 2016-03-22 ENCOUNTER — Emergency Department (HOSPITAL_COMMUNITY)
Admission: EM | Admit: 2016-03-22 | Discharge: 2016-03-22 | Disposition: A | Discharge status: Home / Self Care | Payer: No Typology Code available for payment source | Attending: Emergency Medicine | Admitting: Emergency Medicine

## 2016-03-22 DIAGNOSIS — S79911D Unspecified injury of right hip, subsequent encounter: Secondary | ICD-10-CM | POA: Diagnosis not present

## 2016-03-22 DIAGNOSIS — M545 Low back pain, unspecified: Secondary | ICD-10-CM

## 2016-03-22 DIAGNOSIS — S79921D Unspecified injury of right thigh, subsequent encounter: Secondary | ICD-10-CM | POA: Diagnosis not present

## 2016-03-22 DIAGNOSIS — M25551 Pain in right hip: Secondary | ICD-10-CM

## 2016-03-22 DIAGNOSIS — F17210 Nicotine dependence, cigarettes, uncomplicated: Secondary | ICD-10-CM | POA: Diagnosis not present

## 2016-03-22 DIAGNOSIS — Z79899 Other long term (current) drug therapy: Secondary | ICD-10-CM | POA: Diagnosis not present

## 2016-03-22 DIAGNOSIS — S3992XD Unspecified injury of lower back, subsequent encounter: Secondary | ICD-10-CM | POA: Diagnosis not present

## 2016-03-22 DIAGNOSIS — Z791 Long term (current) use of non-steroidal anti-inflammatories (NSAID): Secondary | ICD-10-CM | POA: Insufficient documentation

## 2016-03-22 DIAGNOSIS — Z7984 Long term (current) use of oral hypoglycemic drugs: Secondary | ICD-10-CM | POA: Insufficient documentation

## 2016-03-22 DIAGNOSIS — Z7952 Long term (current) use of systemic steroids: Secondary | ICD-10-CM | POA: Diagnosis not present

## 2016-03-22 DIAGNOSIS — S199XXD Unspecified injury of neck, subsequent encounter: Secondary | ICD-10-CM | POA: Diagnosis not present

## 2016-03-22 DIAGNOSIS — I10 Essential (primary) hypertension: Secondary | ICD-10-CM | POA: Diagnosis not present

## 2016-03-22 DIAGNOSIS — M791 Myalgia, unspecified site: Secondary | ICD-10-CM

## 2016-03-22 MED ORDER — IBUPROFEN 400 MG PO TABS
800.0000 mg | ORAL_TABLET | Freq: Once | ORAL | Status: AC
  Administered 2016-03-22: 800 mg via ORAL
  Filled 2016-03-22: qty 2

## 2016-03-22 MED ORDER — METHOCARBAMOL 500 MG PO TABS
500.0000 mg | ORAL_TABLET | Freq: Once | ORAL | Status: AC
  Administered 2016-03-22: 500 mg via ORAL
  Filled 2016-03-22: qty 1
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
## 2016-03-22 NOTE — Discharge Instructions (Signed)
Fill prescriptions from previous visit. Ice affected area as needed for additional pain relief.  Follow up with the orthopedic physician listed if symptoms do not improve in 1 week.  Return to ER for any new or worsening symptoms, any additional concerns.

## 2016-03-22 NOTE — ED Notes (Signed)
Declined W/C at D/C and was escorted to lobby by RN. 
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
## 2016-04-19 ENCOUNTER — Ambulatory Visit (HOSPITAL_COMMUNITY)
Admission: EM | Admit: 2016-04-19 | Discharge: 2016-04-19 | Disposition: A | Discharge status: Home / Self Care | Payer: No Typology Code available for payment source | Attending: Emergency Medicine | Admitting: Emergency Medicine

## 2016-04-19 ENCOUNTER — Encounter (HOSPITAL_COMMUNITY): Payer: Self-pay | Admitting: Emergency Medicine

## 2016-04-19 DIAGNOSIS — I10 Essential (primary) hypertension: Secondary | ICD-10-CM

## 2016-04-19 DIAGNOSIS — S29012A Strain of muscle and tendon of back wall of thorax, initial encounter: Secondary | ICD-10-CM

## 2016-04-19 DIAGNOSIS — S46812A Strain of other muscles, fascia and tendons at shoulder and upper arm level, left arm, initial encounter: Secondary | ICD-10-CM

## 2016-04-19 DIAGNOSIS — Z720 Tobacco use: Secondary | ICD-10-CM

## 2016-04-19 DIAGNOSIS — J9801 Acute bronchospasm: Secondary | ICD-10-CM

## 2016-04-19 DIAGNOSIS — M62830 Muscle spasm of back: Secondary | ICD-10-CM

## 2016-04-19 MED ORDER — PREDNISONE 20 MG PO TABS: ORAL_TABLET | ORAL | Status: DC

## 2016-04-19 MED ORDER — ALBUTEROL SULFATE HFA 108 (90 BASE) MCG/ACT IN AERS: 2.0000 | INHALATION_SPRAY | RESPIRATORY_TRACT | Status: DC | PRN

## 2016-04-19 MED ORDER — METHOCARBAMOL 500 MG PO TABS: 500.0000 mg | ORAL_TABLET | Freq: Two times a day (BID) | ORAL | Status: DC

## 2016-04-19 MED ORDER — IPRATROPIUM-ALBUTEROL 0.5-2.5 (3) MG/3ML IN SOLN
3.0000 mL | Freq: Once | RESPIRATORY_TRACT | Status: AC
  Administered 2016-04-19: 3 mL via RESPIRATORY_TRACT

## 2016-04-19 MED ORDER — IPRATROPIUM-ALBUTEROL 0.5-2.5 (3) MG/3ML IN SOLN
RESPIRATORY_TRACT | Status: AC
  Filled 2016-04-19: qty 3
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
## 2016-04-19 NOTE — ED Notes (Signed)
C/o back pain onset this am... Denies inj/trauma... 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 Smokes 1 PPD A&O x4... No acute distress.

## 2016-04-19 NOTE — Discharge Instructions (Signed)
Bronchospasm, Adult  A bronchospasm is a spasm or tightening of the airways going into the lungs. During a bronchospasm breathing becomes more difficult because the airways get smaller. When this happens there can be coughing, a whistling sound when breathing (wheezing), and difficulty breathing. Bronchospasm is often associated with asthma, but not all patients who experience a bronchospasm have asthma.  CAUSES   A bronchospasm is caused by inflammation or irritation of the airways. The inflammation or irritation may be triggered by:   · Allergies (such as to animals, pollen, food, or mold). Allergens that cause bronchospasm may cause wheezing immediately after exposure or many hours later.    · Infection. Viral infections are believed to be the most common cause of bronchospasm.    · Exercise.    · Irritants (such as pollution, cigarette smoke, strong odors, aerosol sprays, and paint fumes).    · Weather changes. Winds increase molds and pollens in the air. Rain refreshes the air by washing irritants out. Cold air may cause inflammation.    · Stress and emotional upset.    SIGNS AND SYMPTOMS   · Wheezing.    · Excessive nighttime coughing.    · Frequent or severe coughing with a simple cold.    · Chest tightness.    · Shortness of breath.    DIAGNOSIS   Bronchospasm is usually diagnosed through a history and physical exam. Tests, such as chest X-rays, are sometimes done to look for other conditions.  TREATMENT   · Inhaled medicines can be given to open up your airways and help you breathe. The medicines can be given using either an inhaler or a nebulizer machine.  · Corticosteroid medicines may be given for severe bronchospasm, usually when it is associated with asthma.  HOME CARE INSTRUCTIONS   · Always have a plan prepared for seeking medical care. Know when to call your health care provider and local emergency services (911 in the [place] Know where you can access local emergency care.  · Only take medicines as  directed by your health care provider.  · If you were prescribed an inhaler or nebulizer machine, ask your health care provider to explain how to use it correctly. Always use a spacer with your inhaler if you were given one.  · It is necessary to remain calm during an attack. Try to relax and breathe more slowly.   · Control your home environment in the following ways:      Change your heating and air conditioning filter at least once a month.      Limit your use of fireplaces and wood stoves.    Do not smoke and do not allow smoking in your home.      Avoid exposure to perfumes and fragrances.      Get rid of pests (such as roaches and mice) and their droppings.      Throw away plants if you see mold on them.      Keep your house clean and dust free.      Replace carpet with wood, tile, or vinyl flooring. Carpet can trap dander and dust.      Use allergy-proof pillows, mattress covers, and box spring covers.      Wash bed sheets and blankets every week in hot water and dry them in a dryer.      Use blankets that are made of polyester or cotton.      Wash hands frequently.  SEEK MEDICAL CARE IF:   · You have muscle aches.    · You have chest pain.    · The sputum changes from clear or   even after taking your prescribed medicines.   You have increased difficulty breathing.   You develop severe chest pain. MAKE SURE YOU:   Understand these instructions.  Will watch your condition.  Will get help right away if you are not doing well or get worse.   This information is not intended to replace advice given to you by your health care provider. Make sure you discuss any questions you have with your health care  provider.   Document Released: 11/20/2003 Document Revised: 12/08/2014 Document Reviewed: 05/09/2013 Elsevier Interactive Patient Education 2016 [employer]  Hypertension Hypertension, commonly called high blood pressure, is when the force of blood pumping through your arteries is too strong. Your arteries are the blood vessels that carry blood from your heart throughout your body. A blood pressure reading consists of a higher number over a lower number, such as 110/72. The higher number (systolic) is the pressure inside your arteries when your heart pumps. The lower number (diastolic) is the pressure inside your arteries when your heart relaxes. Ideally you want your blood pressure below 120/80. Hypertension forces your heart to work harder to pump blood. Your arteries may become narrow or stiff. Having untreated or uncontrolled hypertension can cause heart attack, stroke, kidney disease, and other problems. RISK FACTORS Some risk factors for high blood pressure are controllable. Others are not.  Risk factors you cannot control include:   Race. You may be at higher risk if you are African American.  Age. Risk increases with age.  Gender. Men are at higher risk than women before age 645 years. After age 31, women are at higher risk than men. Risk factors you can control include:  Not getting enough exercise or physical activity.  Being overweight.  Getting too much fat, sugar, calories, or salt in your diet.  Drinking too much alcohol. SIGNS AND SYMPTOMS Hypertension does not usually cause signs or symptoms. Extremely high blood pressure (hypertensive crisis) may cause headache, anxiety, shortness of breath, and nosebleed. DIAGNOSIS To check if you have hypertension, your health care provider will measure your blood pressure while you are seated, with your arm held at the level of your heart. It should be measured at least twice using the same arm. Certain conditions can cause a  difference in blood pressure between your right and left arms. A blood pressure reading that is higher than normal on one occasion does not mean that you need treatment. If it is not clear whether you have high blood pressure, you may be asked to return on a different day to have your blood pressure checked again. Or, you may be asked to monitor your blood pressure at home for 1 or more weeks. TREATMENT Treating high blood pressure includes making lifestyle changes and possibly taking medicine. Living a healthy lifestyle can help lower high blood pressure. You may need to change some of your habits. Lifestyle changes may include:  Following the DASH diet. This diet is high in fruits, vegetables, and whole grains. It is low in salt, red meat, and added sugars.  Keep your sodium intake below 2,300 mg per day.  Getting at least 30-45 minutes of aerobic exercise at least 4 times per week.  Losing weight if necessary.  Not smoking.  Limiting alcoholic beverages.  Learning ways to reduce stress. Your health care provider may prescribe medicine if lifestyle changes are not enough to get your blood pressure under control, and if one of the following is true:  You are 18-59 years  of age and your systolic blood pressure is above 140.  You are 37 years of age or older, and your systolic blood pressure is above 150.  Your diastolic blood pressure is above 90.  You have diabetes, and your systolic blood pressure is over 140 or your diastolic blood pressure is over 90.  You have kidney disease and your blood pressure is above 140/90.  You have heart disease and your blood pressure is above 140/90. Your personal target blood pressure may vary depending on your medical conditions, your age, and other factors. HOME CARE INSTRUCTIONS  Have your blood pressure rechecked as directed by your health care provider.   Take medicines only as directed by your health care provider. Follow the directions  carefully. Blood pressure medicines must be taken as prescribed. The medicine does not work as well when you skip doses. Skipping doses also puts you at risk for problems.  Do not smoke.   Monitor your blood pressure at home as directed by your health care provider. SEEK MEDICAL CARE IF:   You think you are having a reaction to medicines taken.  You have recurrent headaches or feel dizzy.  You have swelling in your ankles.  You have trouble with your vision. SEEK IMMEDIATE MEDICAL CARE IF:  You develop a severe headache or confusion.  You have unusual weakness, numbness, or feel faint.  You have severe chest or abdominal pain.  You vomit repeatedly.  You have trouble breathing. MAKE SURE YOU:   Understand these instructions.  Will watch your condition.  Will get help right away if you are not doing well or get worse.   This information is not intended to replace advice given to you by your health care provider. Make sure you discuss any questions you have with your health care provider.   Document Released: 11/17/2005 Document Revised: 04/03/2015 Document Reviewed: 09/09/2013 Elsevier Interactive Patient Education 2016 Elsevier Inc.  Muscle Strain A muscle strain (pulled muscle) happens when a muscle is stretched beyond normal length. It happens when a sudden, violent force stretches your muscle too far. Usually, a few of the fibers in your muscle are torn. Muscle strain is common in athletes. Recovery usually takes 1-2 weeks. Complete healing takes 5-6 weeks.  HOME CARE   Follow the PRICE method of treatment to help your injury get better. Do this the first 2-3 days after the injury:  Protect. Protect the muscle to keep it from getting injured again.  Rest. Limit your activity and rest the injured body part.  Ice. Put ice in a plastic bag. Place a towel between your skin and the bag. Then, apply the ice and leave it on from 15-20 minutes each hour. After the third  day, switch to moist heat packs.  Compression. Use a splint or elastic bandage on the injured area for comfort. Do not put it on too tightly.  Elevate. Keep the injured body part above the level of your heart.  Only take medicine as told by your doctor.  Warm up before doing exercise to prevent future muscle strains. GET HELP IF:   You have more pain or puffiness (swelling) in the injured area.  You feel numbness, tingling, or notice a loss of strength in the injured area. MAKE SURE YOU:   Understand these instructions.  Will watch your condition.  Will get help right away if you are not doing well or get worse.   This information is not intended to replace advice given to  you by your health care provider. Make sure you discuss any questions you have with your health care provider.   Document Released: 08/26/2008 Document Revised: 09/07/2013 Document Reviewed: 06/16/2013 Elsevier Interactive Patient Education 2016 Elsevier Inc.  Muscle Cramps and Spasms Muscle cramps and spasms occur when a muscle or muscles tighten and you have no control over this tightening (involuntary muscle contraction). They are a common problem and can develop in any muscle. The most common place is in the calf muscles of the leg. Both muscle cramps and muscle spasms are involuntary muscle contractions, but they also have differences:   Muscle cramps are sporadic and painful. They may last a few seconds to a quarter of an hour. Muscle cramps are often more forceful and last longer than muscle spasms.  Muscle spasms may or may not be painful. They may also last just a few seconds or much longer. CAUSES  It is uncommon for cramps or spasms to be due to a serious underlying problem. In many cases, the cause of cramps or spasms is unknown. Some common causes are:   Overexertion.   Overuse from repetitive motions (doing the same thing over and over).   Remaining in a certain position for a long period of  time.   Improper preparation, form, or technique while performing a sport or activity.   Dehydration.   Injury.   Side effects of some medicines.   Abnormally low levels of the salts and ions in your blood (electrolytes), especially potassium and calcium. This could happen if you are taking water pills (diuretics) or you are pregnant.  Some underlying medical problems can make it more likely to develop cramps or spasms. These include, but are not limited to:   Diabetes.   Parkinson disease.   Hormone disorders, such as thyroid problems.   Alcohol abuse.   Diseases specific to muscles, joints, and bones.   Blood vessel disease where not enough blood is getting to the muscles.  HOME CARE INSTRUCTIONS   Stay well hydrated. Drink enough water and fluids to keep your urine clear or pale yellow.  It may be helpful to massage, stretch, and relax the affected muscle.  For tight or tense muscles, use a warm towel, heating pad, or hot shower water directed to the affected area.  If you are sore or have pain after a cramp or spasm, applying ice to the affected area may relieve discomfort.  Put ice in a plastic bag.  Place a towel between your skin and the bag.  Leave the ice on for 15-20 minutes, 03-04 times a day.  Medicines used to treat a known cause of cramps or spasms may help reduce their frequency or severity. Only take over-the-counter or prescription medicines as directed by your caregiver. SEEK MEDICAL CARE IF:  Your cramps or spasms get more severe, more frequent, or do not improve over time.  MAKE SURE YOU:   Understand these instructions.  Will watch your condition.  Will get help right away if you are not doing well or get worse.   This information is not intended to replace advice given to you by your health care provider. Make sure you discuss any questions you have with your health care provider.   Document Released: 05/09/2002 Document Revised:  03/14/2013 Document Reviewed: 11/03/2012 Elsevier Interactive Patient Education [employer] Elsevier Inc.

## 2017-07-05 ENCOUNTER — Emergency Department (HOSPITAL_COMMUNITY): Payer: Self-pay

## 2017-07-05 ENCOUNTER — Encounter (HOSPITAL_COMMUNITY): Payer: Self-pay | Admitting: Emergency Medicine

## 2017-07-05 ENCOUNTER — Emergency Department (HOSPITAL_COMMUNITY)
Admission: EM | Admit: 2017-07-05 | Discharge: 2017-07-05 | Disposition: A | Discharge status: Home / Self Care | Payer: Self-pay | Attending: Emergency Medicine | Admitting: Emergency Medicine

## 2017-07-05 DIAGNOSIS — I10 Essential (primary) hypertension: Secondary | ICD-10-CM | POA: Insufficient documentation

## 2017-07-05 DIAGNOSIS — R739 Hyperglycemia, unspecified: Secondary | ICD-10-CM | POA: Insufficient documentation

## 2017-07-05 DIAGNOSIS — Z79899 Other long term (current) drug therapy: Secondary | ICD-10-CM | POA: Insufficient documentation

## 2017-07-05 DIAGNOSIS — F17210 Nicotine dependence, cigarettes, uncomplicated: Secondary | ICD-10-CM | POA: Insufficient documentation

## 2017-07-05 DIAGNOSIS — R0602 Shortness of breath: Secondary | ICD-10-CM | POA: Insufficient documentation

## 2017-07-05 LAB — I-STAT CHEM 8, ED
BUN: 21 mg/dL — ABNORMAL HIGH (ref 6–20)
CALCIUM ION: 1.12 mmol/L — AB (ref 1.15–1.40)
Chloride: 106 mmol/L (ref 101–111)
Creatinine, Ser: 1.2 mg/dL (ref 0.61–1.24)
Glucose, Bld: 425 mg/dL — ABNORMAL HIGH (ref 65–99)
HCT: 39 % (ref 39.0–52.0)
HEMOGLOBIN: 13.3 g/dL (ref 13.0–17.0)
Potassium: 4.5 mmol/L (ref 3.5–5.1)
SODIUM: 138 mmol/L (ref 135–145)
TCO2: 20 mmol/L (ref 0–100)

## 2017-07-05 LAB — CBC WITH DIFFERENTIAL/PLATELET
BASOS ABS: 0 10*3/uL (ref 0.0–0.1)
BASOS PCT: 0 %
EOS ABS: 0 10*3/uL (ref 0.0–0.7)
Eosinophils Relative: 0 %
HCT: 36.8 % — ABNORMAL LOW (ref 39.0–52.0)
Hemoglobin: 13.1 g/dL (ref 13.0–17.0)
Lymphocytes Relative: 22 %
Lymphs Abs: 1.9 10*3/uL (ref 0.7–4.0)
MCH: 32.7 pg (ref 26.0–34.0)
MCHC: 35.6 g/dL (ref 30.0–36.0)
MCV: 91.8 fL (ref 78.0–100.0)
MONO ABS: 0.2 10*3/uL (ref 0.1–1.0)
MONOS PCT: 3 %
NEUTROS PCT: 75 %
Neutro Abs: 6.6 10*3/uL (ref 1.7–7.7)
Platelets: 258 10*3/uL (ref 150–400)
RBC: 4.01 MIL/uL — ABNORMAL LOW (ref 4.22–5.81)
RDW: 12.8 % (ref 11.5–15.5)
WBC: 8.7 10*3/uL (ref 4.0–10.5)

## 2017-07-05 LAB — BASIC METABOLIC PANEL
Anion gap: 15 (ref 5–15)
BUN: 20 mg/dL (ref 6–20)
CALCIUM: 8.7 mg/dL — AB (ref 8.9–10.3)
CO2: 20 mmol/L — AB (ref 22–32)
CREATININE: 1.53 mg/dL — AB (ref 0.61–1.24)
Chloride: 100 mmol/L — ABNORMAL LOW (ref 101–111)
GFR, EST NON AFRICAN AMERICAN: 59 mL/min — AB (ref >60)
GLUCOSE: 368 mg/dL — AB (ref 65–99)
Potassium: 3.5 mmol/L (ref 3.5–5.1)
Sodium: 135 mmol/L (ref 135–145)

## 2017-07-05 LAB — ETHANOL: ALCOHOL ETHYL (B): 130 mg/dL — AB (ref <5)

## 2017-07-05 MED ORDER — METFORMIN HCL 500 MG PO TABS: 500.0000 mg | ORAL_TABLET | Freq: Two times a day (BID) | ORAL | 0 refills | Status: DC

## 2017-07-05 MED ORDER — LISINOPRIL-HYDROCHLOROTHIAZIDE 10-12.5 MG PO TABS: 1.0000 | ORAL_TABLET | Freq: Every day | ORAL | 0 refills | Status: DC

## 2017-07-05 MED ORDER — SODIUM CHLORIDE 0.9 % IV BOLUS (SEPSIS)
1000.0000 mL | Freq: Once | INTRAVENOUS | Status: AC
  Administered 2017-07-05: 1000 mL via INTRAVENOUS

## 2017-07-05 MED ORDER — ALBUTEROL SULFATE HFA 108 (90 BASE) MCG/ACT IN AERS
2.0000 | INHALATION_SPRAY | RESPIRATORY_TRACT | Status: DC | PRN
  Administered 2017-07-05: 2 via RESPIRATORY_TRACT
  Filled 2017-07-05: qty 6.7

## 2017-07-05 MED ORDER — SODIUM CHLORIDE 0.9 % IV BOLUS (SEPSIS)
1000.0000 mL | Freq: Once | INTRAVENOUS | Status: AC
Start: 2017-07-05 — End: 2017-07-05
  Administered 2017-07-05: 1000 mL via INTRAVENOUS

## 2017-07-05 NOTE — ED Notes (Signed)
Patient placed on oxygen 2 LPM via nasal cannula following neb treatment completion.

## 2017-07-05 NOTE — ED Notes (Signed)
Patient transported to X-ray 

## 2017-07-05 NOTE — ED Notes (Signed)
Pt verbalized understanding discharge instructions and denies any further needs or questions at this time. VS stable, ambulatory and steady gait.   

## 2017-07-05 NOTE — ED Triage Notes (Addendum)
Pt here with [facility] from home for acute sob.  Hx bronchitis and lung surgery.  Given 10 albuterol and 0.5 atrovent and 125 solumedrol.  RR 22, O2 sats 90% ra that increased to 96 on nebx, hr 120.  300 ns given. CBG 331. 18 LFA. ETOH on board.
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
## 2017-09-05 ENCOUNTER — Emergency Department (HOSPITAL_COMMUNITY)
Admission: EM | Admit: 2017-09-05 | Discharge: 2017-09-06 | Disposition: A | Discharge status: Home / Self Care | Payer: Self-pay | Attending: Emergency Medicine | Admitting: Emergency Medicine

## 2017-09-05 ENCOUNTER — Emergency Department (HOSPITAL_COMMUNITY): Payer: Self-pay

## 2017-09-05 DIAGNOSIS — J209 Acute bronchitis, unspecified: Secondary | ICD-10-CM | POA: Insufficient documentation

## 2017-09-05 DIAGNOSIS — R079 Chest pain, unspecified: Secondary | ICD-10-CM | POA: Insufficient documentation

## 2017-09-05 DIAGNOSIS — F17210 Nicotine dependence, cigarettes, uncomplicated: Secondary | ICD-10-CM | POA: Insufficient documentation

## 2017-09-05 DIAGNOSIS — M25559 Pain in unspecified hip: Secondary | ICD-10-CM | POA: Insufficient documentation

## 2017-09-05 DIAGNOSIS — I10 Essential (primary) hypertension: Secondary | ICD-10-CM | POA: Insufficient documentation

## 2017-09-05 DIAGNOSIS — Z79899 Other long term (current) drug therapy: Secondary | ICD-10-CM | POA: Insufficient documentation

## 2017-09-05 LAB — BASIC METABOLIC PANEL
ANION GAP: 9 (ref 5–15)
BUN: 9 mg/dL (ref 6–20)
CALCIUM: 9.3 mg/dL (ref 8.9–10.3)
CHLORIDE: 101 mmol/L (ref 101–111)
CO2: 28 mmol/L (ref 22–32)
Creatinine, Ser: 1.09 mg/dL (ref 0.61–1.24)
GFR calc non Af Amer: >60 mL/min (ref >60)
Glucose, Bld: 217 mg/dL — ABNORMAL HIGH (ref 65–99)
Potassium: 4 mmol/L (ref 3.5–5.1)
Sodium: 138 mmol/L (ref 135–145)

## 2017-09-05 LAB — CBC
HCT: 42 % (ref 39.0–52.0)
HEMOGLOBIN: 14.6 g/dL (ref 13.0–17.0)
MCH: 31.9 pg (ref 26.0–34.0)
MCHC: 34.8 g/dL (ref 30.0–36.0)
MCV: 91.7 fL (ref 78.0–100.0)
Platelets: 263 10*3/uL (ref 150–400)
RBC: 4.58 MIL/uL (ref 4.22–5.81)
RDW: 12.2 % (ref 11.5–15.5)
WBC: 10.2 10*3/uL (ref 4.0–10.5)

## 2017-09-05 LAB — I-STAT TROPONIN, ED: TROPONIN I, POC: 0 ng/mL (ref 0.00–0.08)

## 2017-09-05 MED ORDER — MAGNESIUM SULFATE 2 GM/50ML IV SOLN
2.0000 g | Freq: Once | INTRAVENOUS | Status: AC
  Administered 2017-09-06: 2 g via INTRAVENOUS
  Filled 2017-09-05: qty 50

## 2017-09-05 MED ORDER — ALBUTEROL SULFATE (2.5 MG/3ML) 0.083% IN NEBU
5.0000 mg | INHALATION_SOLUTION | Freq: Once | RESPIRATORY_TRACT | Status: AC
  Administered 2017-09-06: 5 mg via RESPIRATORY_TRACT
  Filled 2017-09-05: qty 6

## 2017-09-05 MED ORDER — MORPHINE SULFATE (PF) 4 MG/ML IV SOLN
4.0000 mg | Freq: Once | INTRAVENOUS | Status: AC
  Administered 2017-09-06: 4 mg via INTRAVENOUS
  Filled 2017-09-05: qty 1

## 2017-09-05 MED ORDER — IPRATROPIUM BROMIDE 0.02 % IN SOLN
0.5000 mg | Freq: Once | RESPIRATORY_TRACT | Status: AC
  Administered 2017-09-06: 0.5 mg via RESPIRATORY_TRACT
  Filled 2017-09-05: qty 2.5

## 2017-09-05 MED ORDER — MORPHINE SULFATE (PF) 4 MG/ML IV SOLN: 4.0000 mg | Freq: Once | INTRAVENOUS | Status: DC

## 2017-09-05 MED ORDER — METHYLPREDNISOLONE SODIUM SUCC 125 MG IJ SOLR
125.0000 mg | Freq: Once | INTRAMUSCULAR | Status: AC
  Administered 2017-09-06: 125 mg via INTRAVENOUS
  Filled 2017-09-05: qty 2

## 2017-09-05 NOTE — ED Triage Notes (Signed)
Pt arrives to Ed via POV; pt c/o central chest pan radiating to middle of back x 2 days; pt states pain increase when he takes a deep breath; pt c/o of some SOB; pt also c/o right hip pain for a couple of weeks; pt unsure if he injured hip at work but he is unable to lie on his side for long; pt a&ox 4 on arrival.

## 2017-09-06 ENCOUNTER — Emergency Department (HOSPITAL_COMMUNITY): Payer: Self-pay

## 2017-09-06 MED ORDER — ALBUTEROL SULFATE HFA 108 (90 BASE) MCG/ACT IN AERS
2.0000 | INHALATION_SPRAY | RESPIRATORY_TRACT | Status: DC
  Administered 2017-09-06: 2 via RESPIRATORY_TRACT
  Filled 2017-09-06: qty 6.7

## 2017-09-06 MED ORDER — PREDNISONE 20 MG PO TABS: 40.0000 mg | ORAL_TABLET | Freq: Every day | ORAL | 0 refills | Status: AC

## 2017-09-06 MED ORDER — ALBUTEROL SULFATE HFA 108 (90 BASE) MCG/ACT IN AERS: 2.0000 | INHALATION_SPRAY | RESPIRATORY_TRACT | 0 refills | Status: DC | PRN
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
## 2018-03-15 ENCOUNTER — Ambulatory Visit (HOSPITAL_COMMUNITY)
Admission: EM | Admit: 2018-03-15 | Discharge: 2018-03-15 | Disposition: A | Discharge status: Home / Self Care | Payer: Commercial Managed Care - PPO | Attending: Urgent Care | Admitting: Urgent Care

## 2018-03-15 ENCOUNTER — Encounter (HOSPITAL_COMMUNITY): Payer: Self-pay | Admitting: Emergency Medicine

## 2018-03-15 DIAGNOSIS — M549 Dorsalgia, unspecified: Secondary | ICD-10-CM | POA: Diagnosis not present

## 2018-03-15 DIAGNOSIS — I10 Essential (primary) hypertension: Secondary | ICD-10-CM | POA: Diagnosis not present

## 2018-03-15 DIAGNOSIS — R030 Elevated blood-pressure reading, without diagnosis of hypertension: Secondary | ICD-10-CM

## 2018-03-15 MED ORDER — MELOXICAM 15 MG PO TABS: 15.0000 mg | ORAL_TABLET | Freq: Every day | ORAL | 0 refills | Status: DC

## 2018-03-15 MED ORDER — KETOROLAC TROMETHAMINE 60 MG/2ML IM SOLN
60.0000 mg | Freq: Once | INTRAMUSCULAR | Status: AC
  Administered 2018-03-15: 60 mg via INTRAMUSCULAR

## 2018-03-15 MED ORDER — KETOROLAC TROMETHAMINE 60 MG/2ML IM SOLN
INTRAMUSCULAR | Status: AC
  Filled 2018-03-15: qty 2

## 2018-03-15 MED ORDER — CYCLOBENZAPRINE HCL 5 MG PO TABS: 5.0000 mg | ORAL_TABLET | Freq: Three times a day (TID) | ORAL | 0 refills | Status: DC | PRN

## 2018-03-15 MED ORDER — LISINOPRIL-HYDROCHLOROTHIAZIDE 10-12.5 MG PO TABS: 1.0000 | ORAL_TABLET | Freq: Every day | ORAL | 0 refills | Status: DC
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
## 2018-03-15 NOTE — Discharge Instructions (Signed)
Wallis BambergMario Jane Broughton, [place] [phone] [facility]

## 2018-03-15 NOTE — ED Triage Notes (Signed)
Pt sts mid back pain x 4 days; pt unsure if injured but worse with movement and lifting

## 2018-04-10 ENCOUNTER — Other Ambulatory Visit: Payer: Self-pay | Admitting: Urgent Care

## 2018-04-12 NOTE — Telephone Encounter (Signed)
Last OV: None listed Last refilled:03/15/18 30 tab/0 refill by [name], [facility] EAV:WUJW listed Pharmacy: [facility] [phone] - [place], [place] - [address] AT [facility] [address] [phone] (Phone) [phone] (Fax)

## 2018-04-13 ENCOUNTER — Ambulatory Visit (HOSPITAL_COMMUNITY)
Admission: EM | Admit: 2018-04-13 | Discharge: 2018-04-13 | Disposition: A | Discharge status: Home / Self Care | Payer: Commercial Managed Care - PPO | Attending: Family Medicine | Admitting: Family Medicine

## 2018-04-13 ENCOUNTER — Encounter (HOSPITAL_COMMUNITY): Payer: Self-pay | Admitting: Family Medicine

## 2018-04-13 DIAGNOSIS — M545 Low back pain, unspecified: Secondary | ICD-10-CM

## 2018-04-13 DIAGNOSIS — R000 Tachycardia, unspecified: Secondary | ICD-10-CM

## 2018-04-13 MED ORDER — PREDNISONE 20 MG PO TABS: 40.0000 mg | ORAL_TABLET | Freq: Every day | ORAL | 0 refills | Status: AC

## 2018-04-13 NOTE — Discharge Instructions (Signed)
Try to limit heavy lifting as able.  Stop meloxicam daily and complete 5 days of prednisone.  May restart meloxicam once completed prednisone. Heat application during activity and ice at night to help with inflammation.  See exercises provided.  Please follow with a primary care provider for recheck of symptoms and management long term.

## 2018-04-13 NOTE — ED Triage Notes (Signed)
Pt here for mid/lower back pain. Was seen here a month ago for the same. Reports drinking to help with the pain.
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
## 2019-05-05 DIAGNOSIS — F17210 Nicotine dependence, cigarettes, uncomplicated: Secondary | ICD-10-CM | POA: Diagnosis not present

## 2019-05-05 DIAGNOSIS — Z0000 Encounter for general adult medical examination without abnormal findings: Secondary | ICD-10-CM | POA: Diagnosis not present

## 2019-05-05 DIAGNOSIS — E559 Vitamin D deficiency, unspecified: Secondary | ICD-10-CM | POA: Diagnosis not present

## 2019-05-05 DIAGNOSIS — R5383 Other fatigue: Secondary | ICD-10-CM | POA: Diagnosis not present

## 2019-07-14 ENCOUNTER — Other Ambulatory Visit: Payer: Self-pay

## 2019-07-14 ENCOUNTER — Encounter (HOSPITAL_COMMUNITY): Payer: Self-pay | Admitting: Emergency Medicine

## 2019-07-14 ENCOUNTER — Ambulatory Visit (HOSPITAL_COMMUNITY)
Admission: EM | Admit: 2019-07-14 | Discharge: 2019-07-14 | Disposition: A | Discharge status: Home / Self Care | Payer: BC Managed Care – PPO | Attending: Family Medicine | Admitting: Family Medicine

## 2019-07-14 ENCOUNTER — Ambulatory Visit (INDEPENDENT_AMBULATORY_CARE_PROVIDER_SITE_OTHER): Payer: BC Managed Care – PPO

## 2019-07-14 DIAGNOSIS — J22 Unspecified acute lower respiratory infection: Secondary | ICD-10-CM | POA: Diagnosis not present

## 2019-07-14 DIAGNOSIS — R05 Cough: Secondary | ICD-10-CM

## 2019-07-14 DIAGNOSIS — Z20828 Contact with and (suspected) exposure to other viral communicable diseases: Secondary | ICD-10-CM | POA: Insufficient documentation

## 2019-07-14 DIAGNOSIS — R6889 Other general symptoms and signs: Secondary | ICD-10-CM | POA: Insufficient documentation

## 2019-07-14 DIAGNOSIS — Z20822 Contact with and (suspected) exposure to covid-19: Secondary | ICD-10-CM

## 2019-07-14 DIAGNOSIS — R079 Chest pain, unspecified: Secondary | ICD-10-CM | POA: Diagnosis not present

## 2019-07-14 DIAGNOSIS — R059 Cough, unspecified: Secondary | ICD-10-CM

## 2019-07-14 MED ORDER — AMOXICILLIN 875 MG PO TABS: 875.0000 mg | ORAL_TABLET | Freq: Two times a day (BID) | ORAL | 0 refills | Status: DC

## 2019-07-14 MED ORDER — ALBUTEROL SULFATE HFA 108 (90 BASE) MCG/ACT IN AERS: 2.0000 | INHALATION_SPRAY | RESPIRATORY_TRACT | 0 refills | Status: DC | PRN
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
## 2019-07-14 NOTE — Discharge Instructions (Signed)
Home to rest.  Drink plenty of fluids Take antibiotic 2 times a day.  Take 2 doses today May take over-the-counter cough and cold medicine as needed Take Tylenol for any pain or fever You must stay home and quarantine until your COVID test result is available.  All positive tests are called promptly.  Negative test or not, you can get results through my chart or by calling the office     Person Under Monitoring Name: Arthur Meadows  Location: [address] [place] [id number]   Infection Prevention Recommendations for Individuals Confirmed to have, or Being Evaluated for, 2019 Novel Coronavirus (COVID-19) Infection Who Receive Care at Home  Individuals who are confirmed to have, or are being evaluated for, COVID-19 should follow the prevention steps below until a healthcare provider or local or state health department says they can return to normal activities.  Stay home except to get medical care You should restrict activities outside your home, except for getting medical care. Do not go to work, school, or public areas, and do not use public transportation or taxis.  Call ahead before visiting your doctor Before your medical appointment, call the healthcare provider and tell them that you have, or are being evaluated for, COVID-19 infection. This will help the healthcare providers office take steps to keep other people from getting infected. Ask your healthcare provider to call the local or state health department.  Monitor your symptoms Seek prompt medical attention if your illness is worsening (e.g., difficulty breathing). Before going to your medical appointment, call the healthcare provider and tell them that you have, or are being evaluated for, COVID-19 infection. Ask your healthcare provider to call the local or state health department.  Wear a facemask You should wear a facemask that covers your nose and mouth when you are in the same room with other people  and when you visit a healthcare provider. People who live with or visit you should also wear a facemask while they are in the same room with you.  Separate yourself from other people in your home As much as possible, you should stay in a different room from other people in your home. Also, you should use a separate bathroom, if available.  Avoid sharing household items You should not share dishes, drinking glasses, cups, eating utensils, towels, bedding, or other items with other people in your home. After using these items, you should wash them thoroughly with soap and water.  Cover your coughs and sneezes Cover your mouth and nose with a tissue when you cough or sneeze, or you can cough or sneeze into your sleeve. Throw used tissues in a lined trash can, and immediately wash your hands with soap and water for at least 20 seconds or use an alcohol-based hand rub.  Wash your [employer] Corporationhands Wash your hands often and thoroughly with soap and water for at least 20 seconds. You can use an alcohol-based hand sanitizer if soap and water are not available and if your hands are not visibly dirty. Avoid touching your eyes, nose, and mouth with unwashed hands.   Prevention Steps for Caregivers and Household Members of Individuals Confirmed to have, or Being Evaluated for, COVID-19 Infection Being Cared for in the Home  If you live with, or provide care at home for, a person confirmed to have, or being evaluated for, COVID-19 infection please follow these guidelines to prevent infection:  Follow healthcare providers instructions Make sure that you understand and can help the patient  follow any healthcare provider instructions for all care.  Provide for the patients basic needs You should help the patient with basic needs in the home and provide support for getting groceries, prescriptions, and other personal needs.  Monitor the patients symptoms If they are getting sicker, call his or her medical  provider and tell them that the patient has, or is being evaluated for, COVID-19 infection. This will help the healthcare providers office take steps to keep other people from getting infected. Ask the healthcare provider to call the local or state health department.  Limit the number of people who have contact with the patient If possible, have only one caregiver for the patient. Other household members should stay in another home or place of residence. If this is not possible, they should stay in another room, or be separated from the patient as much as possible. Use a separate bathroom, if available. Restrict visitors who do not have an essential need to be in the home.  Keep older adults, very young children, and other sick people away from the patient Keep older adults, very young children, and those who have compromised immune systems or chronic health conditions away from the patient. This includes people with chronic heart, lung, or kidney conditions, diabetes, and cancer.  Ensure good ventilation Make sure that shared spaces in the home have good air flow, such as from an air conditioner or an opened window, weather permitting.  Wash your hands often Wash your hands often and thoroughly with soap and water for at least 20 seconds. You can use an alcohol based hand sanitizer if soap and water are not available and if your hands are not visibly dirty. Avoid touching your eyes, nose, and mouth with unwashed hands. Use disposable paper towels to dry your hands. If not available, use dedicated cloth towels and replace them when they become wet.  Wear a facemask and gloves Wear a disposable facemask at all times in the room and gloves when you touch or have contact with the patients blood, body fluids, and/or secretions or excretions, such as sweat, saliva, sputum, nasal mucus, vomit, urine, or feces.  Ensure the mask fits over your nose and mouth tightly, and do not touch it during  use. Throw out disposable facemasks and gloves after using them. Do not reuse. Wash your hands immediately after removing your facemask and gloves. If your personal clothing becomes contaminated, carefully remove clothing and launder. Wash your hands after handling contaminated clothing. Place all used disposable facemasks, gloves, and other waste in a lined container before disposing them with other household waste. Remove gloves and wash your hands immediately after handling these items.  Do not share dishes, glasses, or other household items with the patient Avoid sharing household items. You should not share dishes, drinking glasses, cups, eating utensils, towels, bedding, or other items with a patient who is confirmed to have, or being evaluated for, COVID-19 infection. After the person uses these items, you should wash them thoroughly with soap and water.  Wash laundry thoroughly Immediately remove and wash clothes or bedding that have blood, body fluids, and/or secretions or excretions, such as sweat, saliva, sputum, nasal mucus, vomit, urine, or feces, on them. Wear gloves when handling laundry from the patient. Read and follow directions on labels of laundry or clothing items and detergent. In general, wash and dry with the warmest temperatures recommended on the label.  Clean all areas the individual has used often Clean all touchable surfaces, such as  counters, tabletops, doorknobs, bathroom fixtures, toilets, phones, keyboards, tablets, and bedside tables, every day. Also, clean any surfaces that may have blood, body fluids, and/or secretions or excretions on them. Wear gloves when cleaning surfaces the patient has come in contact with. Use a diluted bleach solution (e.g., dilute bleach with 1 part bleach and 10 parts water) or a household disinfectant with a label that says EPA-registered for coronaviruses. To make a bleach solution at home, add 1 tablespoon of bleach to 1 quart (4  cups) of water. For a larger supply, add  cup of bleach to 1 gallon (16 cups) of water. Read labels of cleaning products and follow recommendations provided on product labels. Labels contain instructions for safe and effective use of the cleaning product including precautions you should take when applying the product, such as wearing gloves or eye protection and making sure you have good ventilation during use of the product. Remove gloves and wash hands immediately after cleaning.  Monitor yourself for signs and symptoms of illness Caregivers and household members are considered close contacts, should monitor their health, and will be asked to limit movement outside of the home to the extent possible. Follow the monitoring steps for close contacts listed on the symptom monitoring form.   ? If you have additional questions, contact your local health department or call the epidemiologist on call at [phone] (available 24/7). ? This guidance is subject to change. For the most up-to-date guidance from [facility], please refer to their website: TripMet[employer].huhttps://www.cdc.gov/coronavirus/2019-ncov/hcp/guidance-prevent-spread.html

## 2019-07-14 NOTE — ED Triage Notes (Signed)
Pt sts cough and pain with cough on right side of chest

## 2019-07-15 LAB — NOVEL CORONAVIRUS, NAA (HOSP ORDER, SEND-OUT TO REF LAB; TAT 18-24 HRS): SARS-CoV-2, NAA: NOT DETECTED

## 2019-07-18 ENCOUNTER — Telehealth (HOSPITAL_COMMUNITY): Payer: Self-pay | Admitting: Emergency Medicine

## 2019-07-18 NOTE — Telephone Encounter (Signed)
Pt called asking about test results, results reported as negative. All questions answered.

## 2019-12-02 ENCOUNTER — Other Ambulatory Visit: Payer: Self-pay

## 2019-12-02 ENCOUNTER — Encounter (HOSPITAL_COMMUNITY): Payer: Self-pay | Admitting: Emergency Medicine

## 2019-12-02 ENCOUNTER — Emergency Department (HOSPITAL_COMMUNITY)
Admission: EM | Admit: 2019-12-02 | Discharge: 2019-12-02 | Disposition: A | Discharge status: Home / Self Care | Payer: BC Managed Care – PPO | Attending: Emergency Medicine | Admitting: Emergency Medicine

## 2019-12-02 DIAGNOSIS — R6889 Other general symptoms and signs: Secondary | ICD-10-CM | POA: Diagnosis not present

## 2019-12-02 DIAGNOSIS — Z5321 Procedure and treatment not carried out due to patient leaving prior to being seen by health care provider: Secondary | ICD-10-CM | POA: Diagnosis not present

## 2019-12-02 NOTE — ED Notes (Signed)
Pt handed labels and ID band to registration leaving [facility]
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
## 2020-01-14 ENCOUNTER — Emergency Department (HOSPITAL_COMMUNITY): Payer: BC Managed Care – PPO

## 2020-01-14 ENCOUNTER — Other Ambulatory Visit: Payer: Self-pay

## 2020-01-14 ENCOUNTER — Encounter (HOSPITAL_COMMUNITY): Payer: Self-pay

## 2020-01-14 ENCOUNTER — Emergency Department (HOSPITAL_COMMUNITY)
Admission: EM | Admit: 2020-01-14 | Discharge: 2020-01-15 | Disposition: A | Discharge status: Home / Self Care | Payer: BC Managed Care – PPO | Attending: Emergency Medicine | Admitting: Emergency Medicine

## 2020-01-14 DIAGNOSIS — E119 Type 2 diabetes mellitus without complications: Secondary | ICD-10-CM | POA: Insufficient documentation

## 2020-01-14 DIAGNOSIS — R000 Tachycardia, unspecified: Secondary | ICD-10-CM | POA: Diagnosis not present

## 2020-01-14 DIAGNOSIS — F17210 Nicotine dependence, cigarettes, uncomplicated: Secondary | ICD-10-CM | POA: Insufficient documentation

## 2020-01-14 DIAGNOSIS — J4521 Mild intermittent asthma with (acute) exacerbation: Secondary | ICD-10-CM

## 2020-01-14 DIAGNOSIS — Z79899 Other long term (current) drug therapy: Secondary | ICD-10-CM | POA: Insufficient documentation

## 2020-01-14 DIAGNOSIS — E1165 Type 2 diabetes mellitus with hyperglycemia: Secondary | ICD-10-CM | POA: Diagnosis not present

## 2020-01-14 DIAGNOSIS — R0602 Shortness of breath: Secondary | ICD-10-CM | POA: Diagnosis not present

## 2020-01-14 DIAGNOSIS — I10 Essential (primary) hypertension: Secondary | ICD-10-CM | POA: Diagnosis not present

## 2020-01-14 DIAGNOSIS — R739 Hyperglycemia, unspecified: Secondary | ICD-10-CM

## 2020-01-14 DIAGNOSIS — Z20822 Contact with and (suspected) exposure to covid-19: Secondary | ICD-10-CM | POA: Diagnosis not present

## 2020-01-14 HISTORY — DX: Unspecified asthma, uncomplicated: J45.909

## 2020-01-14 HISTORY — DX: Type 2 diabetes mellitus without complications: E11.9

## 2020-01-14 LAB — CBC WITH DIFFERENTIAL/PLATELET
Abs Immature Granulocytes: 0.01 10*3/uL (ref 0.00–0.07)
Basophils Absolute: 0 10*3/uL (ref 0.0–0.1)
Basophils Relative: 1 %
Eosinophils Absolute: 0.4 10*3/uL (ref 0.0–0.5)
Eosinophils Relative: 5 %
HCT: 42.5 % (ref 39.0–52.0)
Hemoglobin: 14.4 g/dL (ref 13.0–17.0)
Immature Granulocytes: 0 %
Lymphocytes Relative: 44 %
Lymphs Abs: 3.4 10*3/uL (ref 0.7–4.0)
MCH: 32 pg (ref 26.0–34.0)
MCHC: 33.9 g/dL (ref 30.0–36.0)
MCV: 94.4 fL (ref 80.0–100.0)
Monocytes Absolute: 0.7 10*3/uL (ref 0.1–1.0)
Monocytes Relative: 9 %
Neutro Abs: 3.1 10*3/uL (ref 1.7–7.7)
Neutrophils Relative %: 41 %
Platelets: 271 10*3/uL (ref 150–400)
RBC: 4.5 MIL/uL (ref 4.22–5.81)
RDW: 11.9 % (ref 11.5–15.5)
WBC: 7.6 10*3/uL (ref 4.0–10.5)
nRBC: 0 % (ref 0.0–0.2)

## 2020-01-14 LAB — COMPREHENSIVE METABOLIC PANEL
ALT: 30 U/L (ref 0–44)
AST: 32 U/L (ref 15–41)
Albumin: 4 g/dL (ref 3.5–5.0)
Alkaline Phosphatase: 82 U/L (ref 38–126)
Anion gap: 9 (ref 5–15)
BUN: 15 mg/dL (ref 6–20)
CO2: 28 mmol/L (ref 22–32)
Calcium: 9.2 mg/dL (ref 8.9–10.3)
Chloride: 103 mmol/L (ref 98–111)
Creatinine, Ser: 1.16 mg/dL (ref 0.61–1.24)
GFR calc Af Amer: >60 mL/min (ref >60)
GFR calc non Af Amer: >60 mL/min (ref >60)
Glucose, Bld: 282 mg/dL — ABNORMAL HIGH (ref 70–99)
Potassium: 4.3 mmol/L (ref 3.5–5.1)
Sodium: 140 mmol/L (ref 135–145)
Total Bilirubin: 1.2 mg/dL (ref 0.3–1.2)
Total Protein: 6.6 g/dL (ref 6.5–8.1)

## 2020-01-14 MED ORDER — ALBUTEROL SULFATE HFA 108 (90 BASE) MCG/ACT IN AERS
8.0000 | INHALATION_SPRAY | Freq: Once | RESPIRATORY_TRACT | Status: AC
  Administered 2020-01-14: 8 via RESPIRATORY_TRACT

## 2020-01-14 MED ORDER — IPRATROPIUM BROMIDE HFA 17 MCG/ACT IN AERS
2.0000 | INHALATION_SPRAY | Freq: Once | RESPIRATORY_TRACT | Status: AC
  Administered 2020-01-14: 2 via RESPIRATORY_TRACT

## 2020-01-14 MED ORDER — IPRATROPIUM BROMIDE HFA 17 MCG/ACT IN AERS
2.0000 | INHALATION_SPRAY | Freq: Once | RESPIRATORY_TRACT | Status: AC
  Administered 2020-01-14: 23:00:00 2 via RESPIRATORY_TRACT
  Filled 2020-01-14: qty 12.9

## 2020-01-14 MED ORDER — ALBUTEROL SULFATE HFA 108 (90 BASE) MCG/ACT IN AERS
8.0000 | INHALATION_SPRAY | Freq: Once | RESPIRATORY_TRACT | Status: AC
  Administered 2020-01-14: 23:00:00 8 via RESPIRATORY_TRACT
  Filled 2020-01-14: qty 6.7

## 2020-01-14 MED ORDER — MAGNESIUM SULFATE 2 GM/50ML IV SOLN
2.0000 g | Freq: Once | INTRAVENOUS | Status: AC
  Administered 2020-01-14: 2 g via INTRAVENOUS
  Filled 2020-01-14: qty 50

## 2020-01-14 MED ORDER — AEROCHAMBER PLUS FLO-VU MEDIUM MISC
1.0000 | Freq: Once | Status: AC
  Administered 2020-01-14: 23:00:00 1
  Filled 2020-01-14: qty 1

## 2020-01-14 MED ORDER — METHYLPREDNISOLONE SODIUM SUCC 125 MG IJ SOLR
125.0000 mg | Freq: Once | INTRAMUSCULAR | Status: AC
  Administered 2020-01-14: 23:00:00 125 mg via INTRAVENOUS
  Filled 2020-01-14: qty 2

## 2020-01-14 MED ORDER — ALBUTEROL SULFATE HFA 108 (90 BASE) MCG/ACT IN AERS: 2.0000 | INHALATION_SPRAY | RESPIRATORY_TRACT | Status: DC | PRN
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
## 2020-01-14 NOTE — ED Notes (Signed)
ED Provider at bedside. 

## 2020-01-14 NOTE — ED Triage Notes (Addendum)
Pt from home for asthma. Ran out of inhaler. No cough. Trying to quit smoking. Per ed tech pt was tripoding

## 2020-01-15 ENCOUNTER — Encounter (HOSPITAL_COMMUNITY): Payer: Self-pay

## 2020-01-15 LAB — TROPONIN I (HIGH SENSITIVITY)
Troponin I (High Sensitivity): 17 ng/L (ref <18)
Troponin I (High Sensitivity): 18 ng/L — ABNORMAL HIGH (ref <18)

## 2020-01-15 LAB — RESPIRATORY PANEL BY RT PCR (FLU A&B, COVID)
Influenza A by PCR: NEGATIVE
Influenza B by PCR: NEGATIVE
SARS Coronavirus 2 by RT PCR: NEGATIVE

## 2020-01-15 MED ORDER — IPRATROPIUM BROMIDE 0.02 % IN SOLN
0.5000 mg | Freq: Once | RESPIRATORY_TRACT | Status: AC
  Administered 2020-01-15: 05:00:00 0.5 mg via RESPIRATORY_TRACT
  Filled 2020-01-15: qty 2.5

## 2020-01-15 MED ORDER — ALBUTEROL SULFATE (2.5 MG/3ML) 0.083% IN NEBU
5.0000 mg | INHALATION_SOLUTION | Freq: Once | RESPIRATORY_TRACT | Status: AC
  Administered 2020-01-15: 5 mg via RESPIRATORY_TRACT
  Filled 2020-01-15: qty 6

## 2020-01-15 MED ORDER — PREDNISONE 20 MG PO TABS: 40.0000 mg | ORAL_TABLET | Freq: Every day | ORAL | 0 refills | Status: DC

## 2020-01-15 MED ORDER — METFORMIN HCL 500 MG PO TABS: 500.0000 mg | ORAL_TABLET | Freq: Two times a day (BID) | ORAL | 0 refills | Status: DC

## 2020-01-15 MED ORDER — HYDROCHLOROTHIAZIDE 25 MG PO TABS
25.0000 mg | ORAL_TABLET | Freq: Once | ORAL | Status: AC
  Administered 2020-01-15: 01:00:00 25 mg via ORAL
  Filled 2020-01-15: qty 1

## 2020-01-15 MED ORDER — ALBUTEROL SULFATE HFA 108 (90 BASE) MCG/ACT IN AERS: 2.0000 | INHALATION_SPRAY | RESPIRATORY_TRACT | 0 refills | Status: DC | PRN

## 2020-01-15 MED ORDER — IPRATROPIUM BROMIDE HFA 17 MCG/ACT IN AERS
2.0000 | INHALATION_SPRAY | Freq: Once | RESPIRATORY_TRACT | Status: AC
  Administered 2020-01-15: 01:00:00 2 via RESPIRATORY_TRACT

## 2020-01-15 MED ORDER — ALBUTEROL SULFATE HFA 108 (90 BASE) MCG/ACT IN AERS
8.0000 | INHALATION_SPRAY | Freq: Once | RESPIRATORY_TRACT | Status: AC
  Administered 2020-01-15: 01:00:00 8 via RESPIRATORY_TRACT

## 2020-01-15 MED ORDER — LISINOPRIL-HYDROCHLOROTHIAZIDE 10-12.5 MG PO TABS: 1.0000 | ORAL_TABLET | Freq: Every day | ORAL | 0 refills | Status: DC

## 2020-01-15 NOTE — ED Notes (Signed)
Pt sleeping and maintaining an oxygen saturation of 88-89 % on RA. PA aware and in the room during this assessment. No further orders received at this time.

## 2020-01-15 NOTE — ED Notes (Signed)
Pt placed on oxygen at 2 liters to maintain sats in lower 90's during sleep. Continues to desat around 87-89 while sleeping.

## 2020-01-15 NOTE — Discharge Instructions (Addendum)
1. Medications: albuterol, prednisone, usual home medications °2. Treatment: rest, drink plenty of fluids, begin OTC antihistamine (Zyrtec or Claritin)  °3. Follow Up: Please followup with your primary doctor in 2-3 days for discussion of your diagnoses and further evaluation after today's visit; if you do not have a primary care doctor use the resource guide provided to find one; Please return to the ER for difficulty breathing, high fevers or worsening symptoms. ° °

## 2020-07-11 IMAGING — DX DG CHEST 1V PORT
2 series · 2 of 2 positions shown · non-contrast
Comparison: 07/14/2019

CLINICAL DATA: Shortness of breath, asthma exacerbation

EXAM:
PORTABLE CHEST 1 VIEW

[chest ap (1 of 2)]
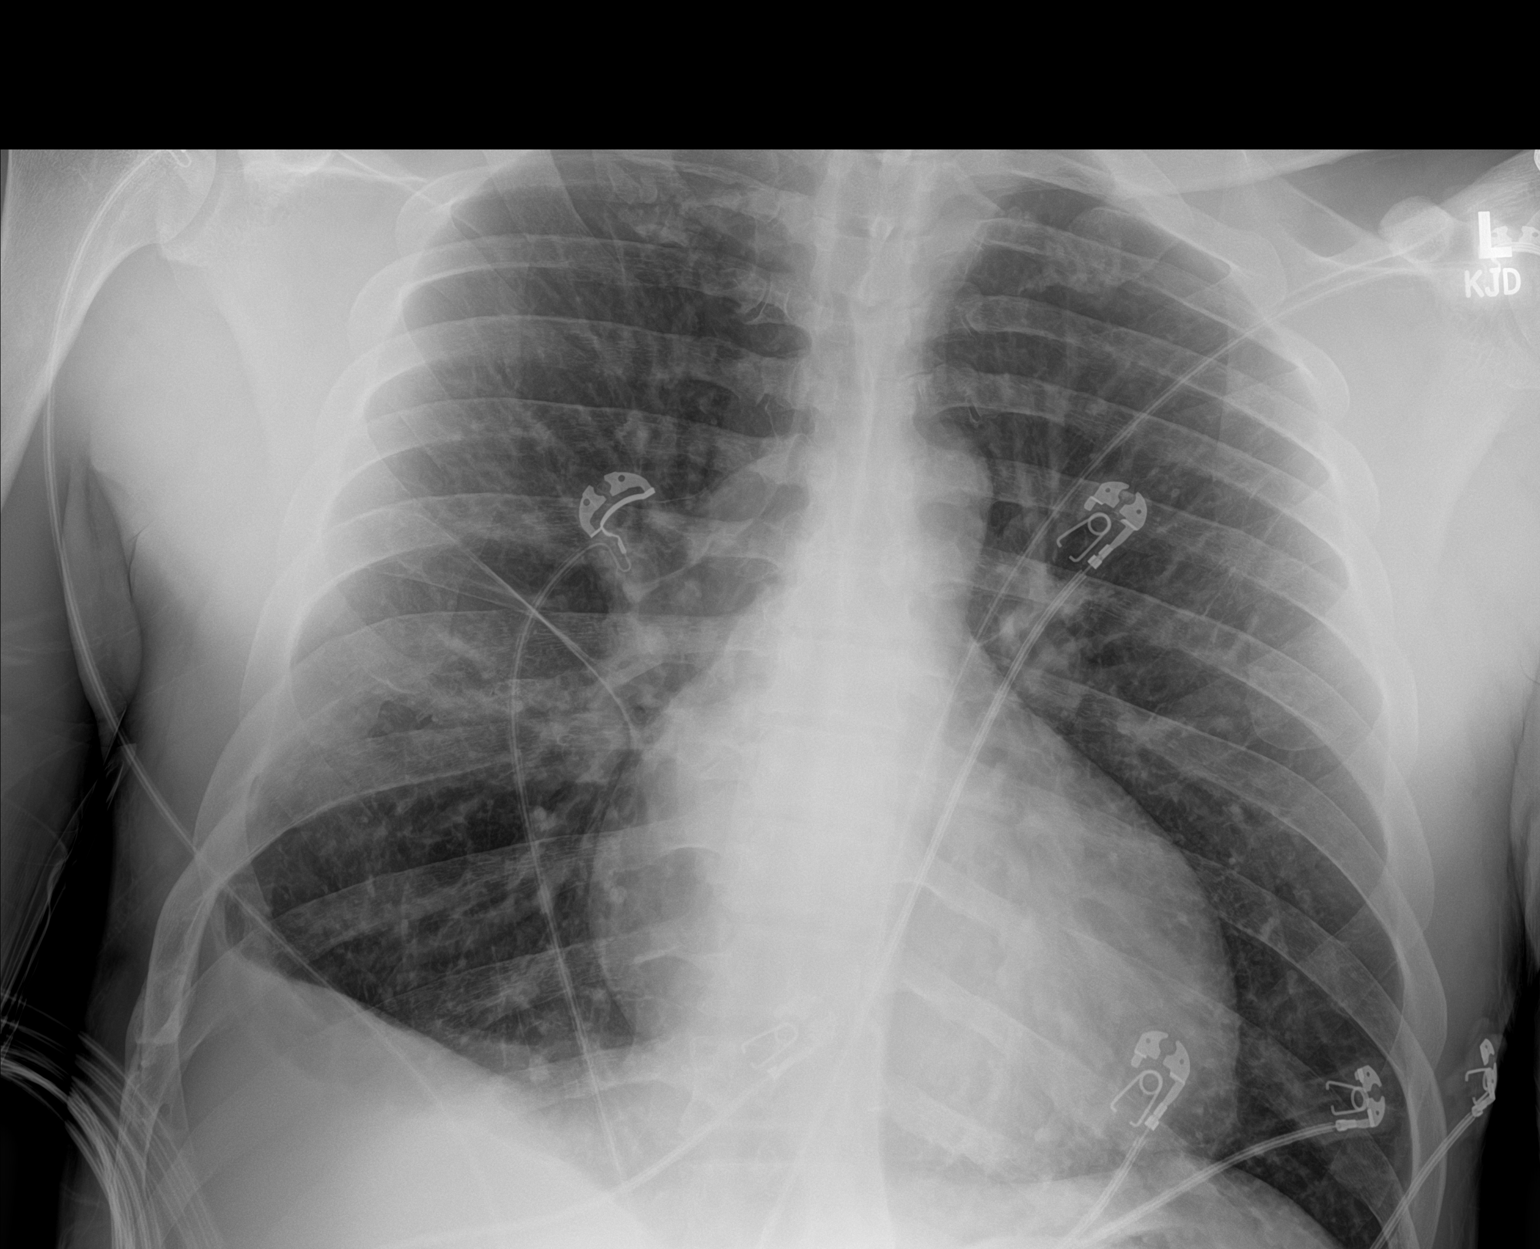

[chest ap (2 of 2)]
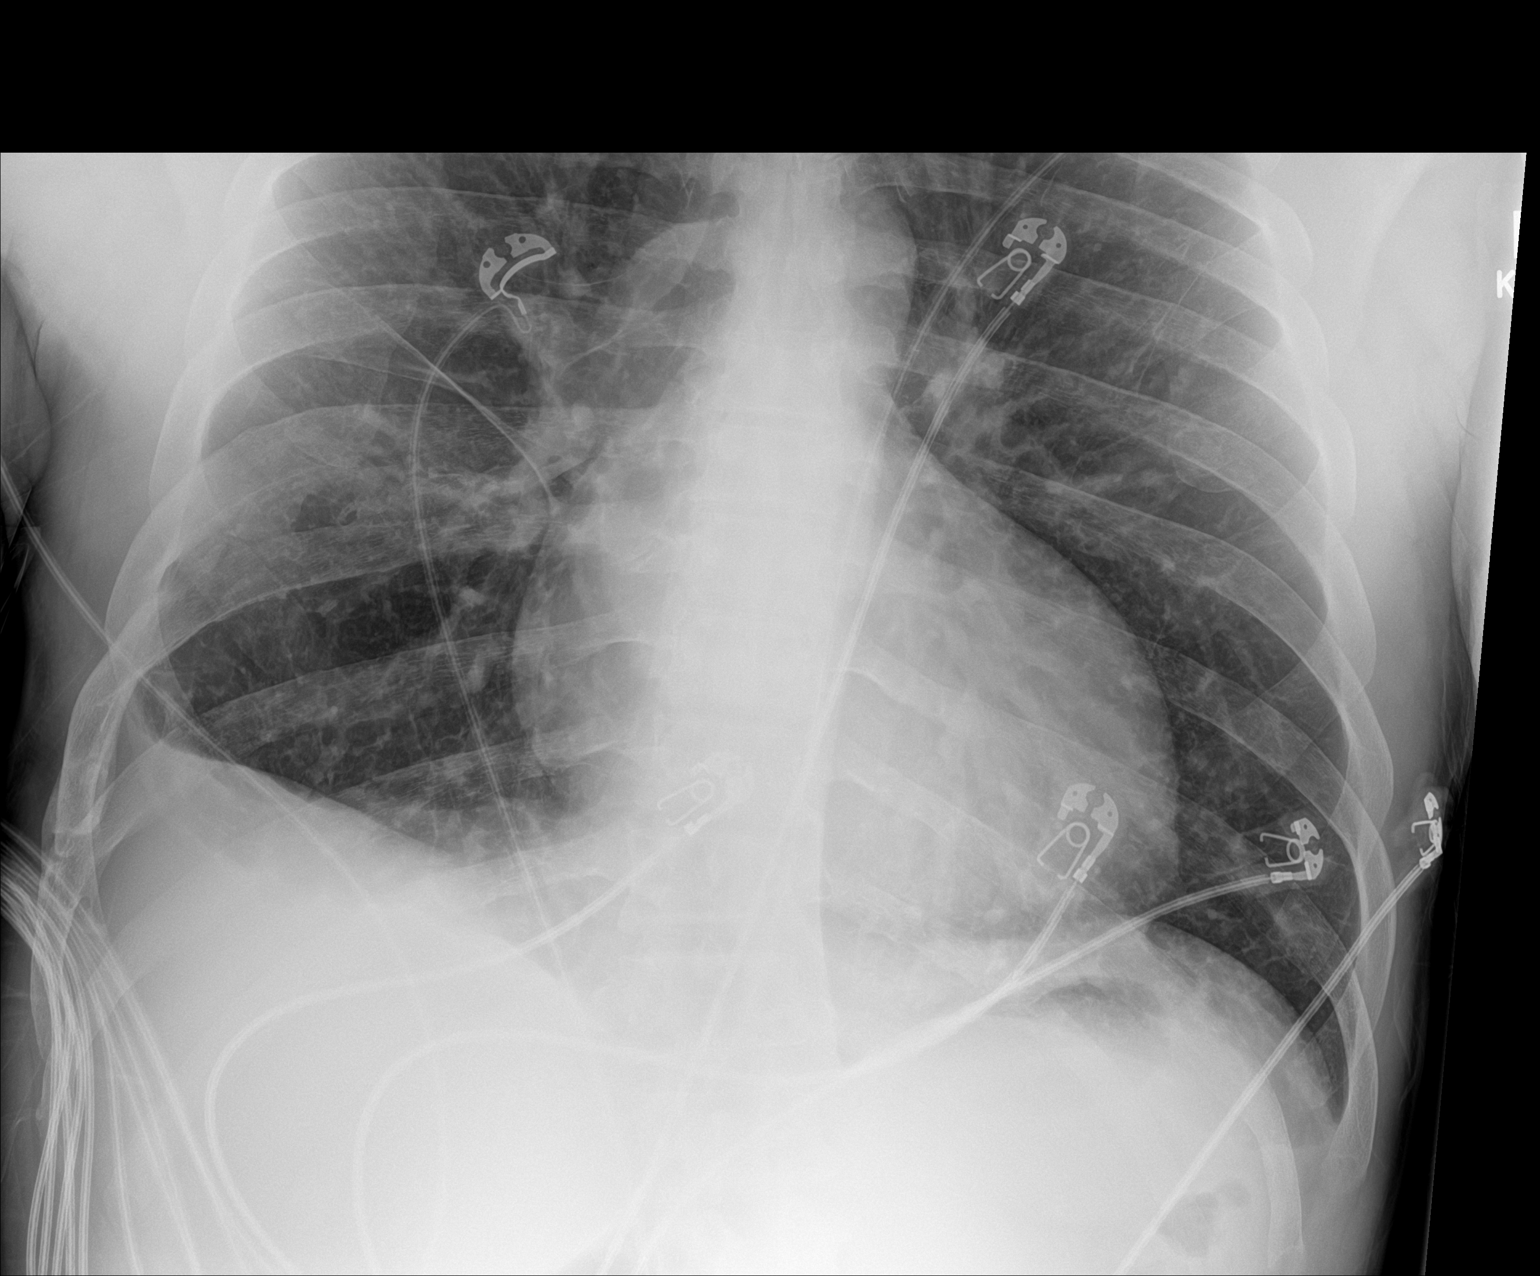

[2 of 2 positions shown; findings below may reference images not displayed]

FINDINGS: No focal consolidation.  No pleural effusion or pneumothorax.

Chronic pleural thickening at the right lung base.

The heart is normal in size.
IMPRESSION: No evidence of acute cardiopulmonary disease.

## 2020-11-15 DIAGNOSIS — Z125 Encounter for screening for malignant neoplasm of prostate: Secondary | ICD-10-CM | POA: Diagnosis not present

## 2020-11-15 DIAGNOSIS — Z23 Encounter for immunization: Secondary | ICD-10-CM | POA: Diagnosis not present

## 2020-11-15 DIAGNOSIS — F17210 Nicotine dependence, cigarettes, uncomplicated: Secondary | ICD-10-CM | POA: Diagnosis not present

## 2020-11-15 DIAGNOSIS — Z0000 Encounter for general adult medical examination without abnormal findings: Secondary | ICD-10-CM | POA: Diagnosis not present

## 2020-11-15 DIAGNOSIS — E1165 Type 2 diabetes mellitus with hyperglycemia: Secondary | ICD-10-CM | POA: Diagnosis not present

## 2020-11-15 DIAGNOSIS — E559 Vitamin D deficiency, unspecified: Secondary | ICD-10-CM | POA: Diagnosis not present

## 2020-11-15 DIAGNOSIS — I10 Essential (primary) hypertension: Secondary | ICD-10-CM | POA: Diagnosis not present

## 2020-12-05 DIAGNOSIS — S335XXD Sprain of ligaments of lumbar spine, subsequent encounter: Secondary | ICD-10-CM | POA: Diagnosis not present

## 2020-12-05 DIAGNOSIS — E1165 Type 2 diabetes mellitus with hyperglycemia: Secondary | ICD-10-CM | POA: Diagnosis not present

## 2020-12-05 DIAGNOSIS — N529 Male erectile dysfunction, unspecified: Secondary | ICD-10-CM | POA: Diagnosis not present

## 2020-12-05 DIAGNOSIS — I10 Essential (primary) hypertension: Secondary | ICD-10-CM | POA: Diagnosis not present

## 2020-12-26 ENCOUNTER — Ambulatory Visit: Payer: BC Managed Care – PPO | Admitting: Dietician

## 2021-01-23 DIAGNOSIS — M25511 Pain in right shoulder: Secondary | ICD-10-CM | POA: Diagnosis not present

## 2021-01-23 DIAGNOSIS — I10 Essential (primary) hypertension: Secondary | ICD-10-CM | POA: Diagnosis not present

## 2021-01-23 DIAGNOSIS — E1165 Type 2 diabetes mellitus with hyperglycemia: Secondary | ICD-10-CM | POA: Diagnosis not present

## 2021-01-23 DIAGNOSIS — F17210 Nicotine dependence, cigarettes, uncomplicated: Secondary | ICD-10-CM | POA: Diagnosis not present

## 2021-02-19 DIAGNOSIS — E1165 Type 2 diabetes mellitus with hyperglycemia: Secondary | ICD-10-CM | POA: Diagnosis not present

## 2021-02-19 DIAGNOSIS — I10 Essential (primary) hypertension: Secondary | ICD-10-CM | POA: Diagnosis not present

## 2021-02-19 DIAGNOSIS — J069 Acute upper respiratory infection, unspecified: Secondary | ICD-10-CM | POA: Diagnosis not present

## 2021-02-19 DIAGNOSIS — F17210 Nicotine dependence, cigarettes, uncomplicated: Secondary | ICD-10-CM | POA: Diagnosis not present

## 2021-03-28 DIAGNOSIS — E1165 Type 2 diabetes mellitus with hyperglycemia: Secondary | ICD-10-CM | POA: Diagnosis not present

## 2021-03-28 DIAGNOSIS — I10 Essential (primary) hypertension: Secondary | ICD-10-CM | POA: Diagnosis not present

## 2021-03-28 DIAGNOSIS — J302 Other seasonal allergic rhinitis: Secondary | ICD-10-CM | POA: Diagnosis not present

## 2022-01-23 ENCOUNTER — Other Ambulatory Visit: Payer: Self-pay | Admitting: Internal Medicine

## 2022-01-23 LAB — CBC
HCT: 37.7 % — ABNORMAL LOW (ref 38.5–50.0)
Hemoglobin: 12.4 g/dL — ABNORMAL LOW (ref 13.2–17.1)
MCH: 31.4 pg (ref 27.0–33.0)
MCHC: 32.9 g/dL (ref 32.0–36.0)
MCV: 95.4 fL (ref 80.0–100.0)
MPV: 11.6 fL (ref 7.5–12.5)
Platelets: 208 10*3/uL (ref 140–400)
RBC: 3.95 10*6/uL — ABNORMAL LOW (ref 4.20–5.80)
RDW: 11.9 % (ref 11.0–15.0)
WBC: 5.3 10*3/uL (ref 3.8–10.8)

## 2022-01-23 LAB — COMPLETE METABOLIC PANEL WITH GFR
AG Ratio: 1.5 (calc) (ref 1.0–2.5)
ALT: 26 U/L (ref 9–46)
AST: 21 U/L (ref 10–40)
Albumin: 3.7 g/dL (ref 3.6–5.1)
Alkaline phosphatase (APISO): 75 U/L (ref 36–130)
BUN/Creatinine Ratio: 14 (calc) (ref 6–22)
BUN: 20 mg/dL (ref 7–25)
CO2: 27 mmol/L (ref 20–32)
Calcium: 8.6 mg/dL (ref 8.6–10.3)
Chloride: 106 mmol/L (ref 98–110)
Creat: 1.39 mg/dL — ABNORMAL HIGH (ref 0.60–1.26)
Globulin: 2.5 g/dL (calc) (ref 1.9–3.7)
Glucose, Bld: 189 mg/dL — ABNORMAL HIGH (ref 65–99)
Potassium: 4.1 mmol/L (ref 3.5–5.3)
Sodium: 141 mmol/L (ref 135–146)
Total Bilirubin: 1.1 mg/dL (ref 0.2–1.2)
Total Protein: 6.2 g/dL (ref 6.1–8.1)
eGFR: 67 mL/min/{1.73_m2} (ref >=60)

## 2022-01-23 LAB — VITAMIN D 25 HYDROXY (VIT D DEFICIENCY, FRACTURES): Vit D, 25-Hydroxy: 17 ng/mL — ABNORMAL LOW (ref 30–100)

## 2022-01-23 LAB — LIPID PANEL
Cholesterol: 124 mg/dL (ref <200)
HDL: 50 mg/dL (ref >=40)
LDL Cholesterol (Calc): 59 mg/dL (calc)
Non-HDL Cholesterol (Calc): 74 mg/dL (calc) (ref <130)
Total CHOL/HDL Ratio: 2.5 (calc) (ref <5.0)
Triglycerides: 74 mg/dL (ref <150)

## 2022-01-23 LAB — TSH: TSH: 0.84 mIU/L (ref 0.40–4.50)

## 2022-02-17 ENCOUNTER — Other Ambulatory Visit: Payer: Self-pay

## 2022-02-17 ENCOUNTER — Encounter (HOSPITAL_COMMUNITY): Payer: Self-pay | Admitting: Emergency Medicine

## 2022-02-17 ENCOUNTER — Emergency Department (HOSPITAL_COMMUNITY)
Admission: EM | Admit: 2022-02-17 | Discharge: 2022-02-17 | Disposition: A | Discharge status: Home / Self Care | Payer: BC Managed Care – PPO | Attending: Emergency Medicine | Admitting: Emergency Medicine

## 2022-02-17 DIAGNOSIS — K047 Periapical abscess without sinus: Secondary | ICD-10-CM | POA: Insufficient documentation

## 2022-02-17 DIAGNOSIS — Z7984 Long term (current) use of oral hypoglycemic drugs: Secondary | ICD-10-CM | POA: Insufficient documentation

## 2022-02-17 DIAGNOSIS — Z79899 Other long term (current) drug therapy: Secondary | ICD-10-CM | POA: Insufficient documentation

## 2022-02-17 MED ORDER — AMOXICILLIN 500 MG PO CAPS: 500.0000 mg | ORAL_CAPSULE | Freq: Three times a day (TID) | ORAL | 0 refills | Status: DC

## 2022-02-17 MED ORDER — DEXAMETHASONE SODIUM PHOSPHATE 10 MG/ML IJ SOLN
10.0000 mg | Freq: Once | INTRAMUSCULAR | Status: AC
  Administered 2022-02-17: 10 mg via INTRAMUSCULAR
  Filled 2022-02-17: qty 1

## 2022-02-17 NOTE — Discharge Instructions (Addendum)
Take amoxicillin 3 times daily for the next 7 days.  Try taking Tylenol for the pain, you were given a steroid which should help alleviate the swelling for the next 3 days.  Return if you are unable to swallow if you get a fever ?

## 2022-02-17 NOTE — ED Provider Notes (Signed)
?[facility] EMERGENCY DEPARTMENT ?Provider Note ? ? ?CSN: [id number] ?Arrival date & time: 02/17/22  7829 ? ?  ? ?History ? ?No chief complaint on file. ? ? ?Arthur Meadows is a 36 y.o. male. ? ?HPI ? ? Patient is a 36 year old male presenting due to dental pain.  Started about a week ago, its worse on the right and upper and lower sides of his molars.  He has not seen a dentist and is waiting for his benefits to CAT scan.  He has tried ibuprofen and BC powder and Aleve with minimal results of improvement.  Denies any difficulty swallowing, no fevers. ? ?Home Medications ?Prior to Admission medications   ?Medication Sig Start Date End Date Taking? Authorizing Provider  ?albuterol (VENTOLIN HFA) 108 (90 Base) MCG/ACT inhaler Inhale 2 puffs into the lungs every 4 (four) hours as needed for wheezing or shortness of breath. 01/15/20   Muthersbaugh, Dahlia Client, PA-C  ?atorvastatin (LIPITOR) 10 MG tablet Take 10 mg by mouth at bedtime. 01/23/22   [provider]  ?cetirizine (ZYRTEC) 10 MG tablet Take 10 mg by mouth daily as needed for allergies. 01/23/22   [provider]  ?ibuprofen (ADVIL) 800 MG tablet Take 800 mg by mouth 2 (two) times daily as needed for pain. 02/11/22   [provider]  ?lisinopril-hydrochlorothiazide (ZESTORETIC) 10-12.5 MG tablet Take 1 tablet by mouth daily. 01/15/20   Muthersbaugh, Dahlia Client, PA-C  ?losartan-hydrochlorothiazide (HYZAAR) 50-12.5 MG tablet Take 1 tablet by mouth daily. 01/23/22   [provider]  ?metFORMIN (GLUCOPHAGE) 500 MG tablet Take 1 tablet (500 mg total) by mouth 2 (two) times daily with a meal. 01/15/20   Muthersbaugh, Dahlia Client, PA-C  ?Spacer/Aero-Holding Rudean Curt Use as directed 01/31/15   Ozella Rocks, MD  ?Vitamin D, Ergocalciferol, (DRISDOL) 1.25 MG (50000 UNIT) CAPS capsule Take 50,000 Units by mouth once a week. 01/27/22   [provider]  ?   ? ?Allergies    ?Iodinated contrast media, Pollen extract, and  Watermelon [citrullus vulgaris]   ? ?Review of Systems   ?Review of Systems ? ?Physical Exam ?Updated Vital Signs ?BP (!) 171/109   Pulse 98   Temp 98 ?F (36.7 ?C) (Oral)   Resp 14   SpO2 95%  ?Physical Exam ?Vitals and nursing note reviewed. Exam conducted with a chaperone present.  ?Constitutional:   ?   Appearance: Normal appearance.  ?HENT:  ?   Head: Normocephalic.  ?   Mouth/Throat:  ?   Mouth: Mucous membranes are moist.  ?   Pharynx: No oropharyngeal exudate or posterior oropharyngeal erythema.  ?   Comments: No sublingual tenderness or swelling. Uvula is midline. No trismus. No dry sockets or pulp exposure. Handling secretions without difficulty.  Right upper jaw with dental abscess ?Eyes:  ?   Extraocular Movements: Extraocular movements intact.  ?   Pupils: Pupils are equal, round, and reactive to light.  ?Cardiovascular:  ?   Rate and Rhythm: Normal rate and regular rhythm.  ?Pulmonary:  ?   Effort: Pulmonary effort is normal.  ?   Breath sounds: Normal breath sounds.  ?Musculoskeletal:  ?   Cervical back: Normal range of motion. No rigidity or tenderness.  ?Neurological:  ?   Mental Status: He is alert.  ?Psychiatric:     ?   Mood and Affect: Mood normal.  ? ? ?ED Results / Procedures / Treatments   ?Labs ?(all labs ordered are listed, but only abnormal results are displayed) ?  Labs Reviewed - No data to display ? ?EKG ?None ? ?Radiology ?No results found. ? ?Procedures ?Procedures  ? ? ?Medications Ordered in ED ?Medications  ?dexamethasone (DECADRON) injection 10 mg (has no administration in time range)  ? ? ?ED Course/ Medical Decision Making/ A&P ?  ?                        ?Medical Decision Making ? ?36 year old male presenting due to dental pain.  No contributing comorbidities, he does have medical and dental insurance but he is waiting for benefit.  She said in affecting his ability to get care outpatient.  On exam it is notable for dental abscess, no sublingual swelling there would be  suggestive of [place] angina.  Considered but ultimately doubt peritonsillar or retropharyngeal abscess based on exam.  We will give shot of Decadron given he has been on anti-inflammatories with minimal improvement.  We will also discharge with amoxicillin and have him follow-up with dental group. ? ? ? ? ? ? ? ?Final Clinical Impression(s) / ED Diagnoses ?Final diagnoses:  ?None  ? ? ?Rx / DC Orders ?ED Discharge Orders   ? ? None  ? ?  ? ? ?  ?Theron Arista, PA-C ?02/17/22 9735 ? ?  ?Pricilla Loveless, MD ?02/17/22 [phone] ? ?

## 2022-02-17 NOTE — ED Triage Notes (Signed)
Pt reported to ED with c/o dental pain on right upper and lower molars since Monday. States he has hd trouble with wisdom teeth.  ?

## 2022-05-21 ENCOUNTER — Encounter (HOSPITAL_COMMUNITY): Payer: Self-pay

## 2022-05-21 ENCOUNTER — Ambulatory Visit (HOSPITAL_COMMUNITY)
Admission: EM | Admit: 2022-05-21 | Discharge: 2022-05-21 | Disposition: A | Discharge status: Home / Self Care | Payer: Self-pay | Attending: Family Medicine | Admitting: Family Medicine

## 2022-05-21 DIAGNOSIS — J4521 Mild intermittent asthma with (acute) exacerbation: Secondary | ICD-10-CM

## 2022-05-21 DIAGNOSIS — K0889 Other specified disorders of teeth and supporting structures: Secondary | ICD-10-CM

## 2022-05-21 MED ORDER — DEXAMETHASONE SODIUM PHOSPHATE 10 MG/ML IJ SOLN
INTRAMUSCULAR | Status: AC
  Filled 2022-05-21: qty 1

## 2022-05-21 MED ORDER — AMOXICILLIN 875 MG PO TABS: 875.0000 mg | ORAL_TABLET | Freq: Two times a day (BID) | ORAL | 0 refills | Status: AC

## 2022-05-21 MED ORDER — PREDNISONE 20 MG PO TABS: 40.0000 mg | ORAL_TABLET | Freq: Every day | ORAL | 0 refills | Status: DC

## 2022-05-21 MED ORDER — DEXAMETHASONE SODIUM PHOSPHATE 10 MG/ML IJ SOLN
10.0000 mg | Freq: Once | INTRAMUSCULAR | Status: AC
  Administered 2022-05-21: 10 mg via INTRAMUSCULAR

## 2022-05-21 NOTE — ED Triage Notes (Signed)
Pt reports asthma flare-up x 1 week. He is using his Albuterol with no relief. Last use inhaler 30 minutes ago.  Pt reports dental pain x 2 weeks.
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
## 2022-06-06 ENCOUNTER — Encounter (HOSPITAL_COMMUNITY): Payer: Self-pay

## 2022-06-06 ENCOUNTER — Ambulatory Visit (HOSPITAL_COMMUNITY)
Admission: EM | Admit: 2022-06-06 | Discharge: 2022-06-06 | Disposition: A | Discharge status: Home / Self Care | Payer: Self-pay | Attending: Physician Assistant | Admitting: Physician Assistant

## 2022-06-06 DIAGNOSIS — R519 Headache, unspecified: Secondary | ICD-10-CM

## 2022-06-06 DIAGNOSIS — L03211 Cellulitis of face: Secondary | ICD-10-CM

## 2022-06-06 DIAGNOSIS — L723 Sebaceous cyst: Secondary | ICD-10-CM

## 2022-06-06 MED ORDER — SULFAMETHOXAZOLE-TRIMETHOPRIM 800-160 MG PO TABS: 1.0000 | ORAL_TABLET | Freq: Two times a day (BID) | ORAL | 0 refills | Status: AC

## 2022-06-06 NOTE — Discharge Instructions (Addendum)
Advised to take Tylenol or ibuprofen as needed for facial pain. Advised to take Bactrim DS 1 every 12 hours until completed. Advised to use warm compresses to the area frequently to help encourage drainage. Advised to follow-up PCP or return to urgent care if symptoms fail to improve.

## 2022-06-06 NOTE — ED Triage Notes (Addendum)
Patient having facial swelling on the left side. Onset yesterday morning.   Patient had a bump on the left cheek and he popped it yesterday before the facial swelling.   No changes diet, medications, or skin care. No changes in detergent. No bug bites that Patient knows of. No new activities outside. No pain, itching, or SOB.  Patient states he finished an antibiotic for an abscess yesterday night.    No one around the Patient with similar symptoms.
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
## 2022-08-25 DIAGNOSIS — H4311 Vitreous hemorrhage, right eye: Secondary | ICD-10-CM | POA: Diagnosis not present

## 2022-10-01 DIAGNOSIS — E113521 Type 2 diabetes mellitus with proliferative diabetic retinopathy with traction retinal detachment involving the macula, right eye: Secondary | ICD-10-CM | POA: Diagnosis not present

## 2022-10-31 DIAGNOSIS — E113521 Type 2 diabetes mellitus with proliferative diabetic retinopathy with traction retinal detachment involving the macula, right eye: Secondary | ICD-10-CM | POA: Diagnosis not present

## 2022-11-11 DIAGNOSIS — H3341 Traction detachment of retina, right eye: Secondary | ICD-10-CM | POA: Diagnosis not present

## 2022-11-11 DIAGNOSIS — E113521 Type 2 diabetes mellitus with proliferative diabetic retinopathy with traction retinal detachment involving the macula, right eye: Secondary | ICD-10-CM | POA: Diagnosis not present

## 2022-11-11 DIAGNOSIS — H33001 Unspecified retinal detachment with retinal break, right eye: Secondary | ICD-10-CM | POA: Diagnosis not present

## 2022-11-19 DIAGNOSIS — Z09 Encounter for follow-up examination after completed treatment for conditions other than malignant neoplasm: Secondary | ICD-10-CM | POA: Diagnosis not present

## 2023-05-06 ENCOUNTER — Encounter (HOSPITAL_COMMUNITY): Payer: Self-pay

## 2023-05-06 ENCOUNTER — Ambulatory Visit (HOSPITAL_COMMUNITY)
Admission: EM | Admit: 2023-05-06 | Discharge: 2023-05-06 | Disposition: A | Discharge status: Home / Self Care | Payer: Commercial Managed Care - PPO | Attending: Physician Assistant | Admitting: Physician Assistant

## 2023-05-06 DIAGNOSIS — I10 Essential (primary) hypertension: Secondary | ICD-10-CM | POA: Diagnosis present

## 2023-05-06 DIAGNOSIS — E119 Type 2 diabetes mellitus without complications: Secondary | ICD-10-CM | POA: Insufficient documentation

## 2023-05-06 DIAGNOSIS — Z760 Encounter for issue of repeat prescription: Secondary | ICD-10-CM | POA: Insufficient documentation

## 2023-05-06 LAB — COMPREHENSIVE METABOLIC PANEL
ALT: 25 U/L (ref 0–44)
AST: 35 U/L (ref 15–41)
Albumin: 3.5 g/dL (ref 3.5–5.0)
Alkaline Phosphatase: 68 U/L (ref 38–126)
Anion gap: 7 (ref 5–15)
BUN: 14 mg/dL (ref 6–20)
CO2: 29 mmol/L (ref 22–32)
Calcium: 9 mg/dL (ref 8.9–10.3)
Chloride: 106 mmol/L (ref 98–111)
Creatinine, Ser: 1.33 mg/dL — ABNORMAL HIGH (ref 0.61–1.24)
GFR, Estimated: >60 mL/min (ref >60)
Glucose, Bld: 137 mg/dL — ABNORMAL HIGH (ref 70–99)
Potassium: 4.5 mmol/L (ref 3.5–5.1)
Sodium: 142 mmol/L (ref 135–145)
Total Bilirubin: 1.3 mg/dL — ABNORMAL HIGH (ref 0.3–1.2)
Total Protein: 6 g/dL — ABNORMAL LOW (ref 6.5–8.1)

## 2023-05-06 LAB — POCT URINALYSIS DIP (MANUAL ENTRY)
Bilirubin, UA: NEGATIVE
Glucose, UA: NEGATIVE mg/dL
Ketones, POC UA: NEGATIVE mg/dL
Leukocytes, UA: NEGATIVE
Nitrite, UA: NEGATIVE
Protein Ur, POC: >=300 mg/dL — AB
Spec Grav, UA: 1.02 (ref 1.010–1.025)
Urobilinogen, UA: 1 E.U./dL
pH, UA: 6 (ref 5.0–8.0)

## 2023-05-06 LAB — HEMOGLOBIN A1C
Hgb A1c MFr Bld: 6.2 % — ABNORMAL HIGH (ref 4.8–5.6)
Mean Plasma Glucose: 131 mg/dL

## 2023-05-06 LAB — POCT FASTING CBG KUC MANUAL ENTRY: POCT Glucose (KUC): 134 mg/dL — AB (ref 70–99)

## 2023-05-06 MED ORDER — LOSARTAN POTASSIUM-HCTZ 50-12.5 MG PO TABS: 1.0000 | ORAL_TABLET | Freq: Every day | ORAL | 0 refills | Status: DC

## 2023-05-06 NOTE — ED Triage Notes (Signed)
Pt states out of meds x2ks, requesting refill for his losartan/hctz. States starting to have mild headaches and swelling to legs.

## 2023-05-06 NOTE — Discharge Instructions (Addendum)
We are going to restart her blood pressure medic.  Please start losartan hydrochlorothiazide.  We will contact you if your blood work is abnormal.  Avoid salt, caffeine, sodium, decongestants, NSAIDs (aspirin, ibuprofen/Advil, naproxen/Aleve).  We will try to help you find a primary care but can return here for recheck if needed.  Monitor your blood pressure at home.  After you have been on your medication for over a week if this remains above 140/90 she returns we can adjust her medication.  If you develop any chest pain, shortness of breath, headache, vision change, dizziness in the setting of high blood pressure you need to be seen immediately.  Your blood sugar was appropriate in clinic today.  I am sending off an A1c which is a 27-month average of your blood sugar.  We will hold off on starting medication until we get this back.  Continue exercising and monitoring her diet for carbohydrates.
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
## 2023-05-13 ENCOUNTER — Encounter (HOSPITAL_COMMUNITY): Payer: Self-pay

## 2023-05-15 ENCOUNTER — Ambulatory Visit (HOSPITAL_COMMUNITY)
Admission: EM | Admit: 2023-05-15 | Discharge: 2023-05-15 | Disposition: A | Discharge status: Home / Self Care | Payer: Commercial Managed Care - PPO | Attending: Emergency Medicine | Admitting: Emergency Medicine

## 2023-05-15 ENCOUNTER — Encounter (HOSPITAL_COMMUNITY): Payer: Self-pay

## 2023-05-15 DIAGNOSIS — I10 Essential (primary) hypertension: Secondary | ICD-10-CM

## 2023-05-15 LAB — COMPREHENSIVE METABOLIC PANEL
ALT: 29 U/L (ref 0–44)
AST: 28 U/L (ref 15–41)
Albumin: 3.9 g/dL (ref 3.5–5.0)
Alkaline Phosphatase: 75 U/L (ref 38–126)
Anion gap: 8 (ref 5–15)
BUN: 23 mg/dL — ABNORMAL HIGH (ref 6–20)
CO2: 26 mmol/L (ref 22–32)
Calcium: 9.1 mg/dL (ref 8.9–10.3)
Chloride: 104 mmol/L (ref 98–111)
Creatinine, Ser: 1.58 mg/dL — ABNORMAL HIGH (ref 0.61–1.24)
GFR, Estimated: 57 mL/min — ABNORMAL LOW (ref >60)
Glucose, Bld: 148 mg/dL — ABNORMAL HIGH (ref 70–99)
Potassium: 4.7 mmol/L (ref 3.5–5.1)
Sodium: 138 mmol/L (ref 135–145)
Total Bilirubin: 1.4 mg/dL — ABNORMAL HIGH (ref 0.3–1.2)
Total Protein: 6.8 g/dL (ref 6.5–8.1)

## 2023-05-15 LAB — POCT URINALYSIS DIP (MANUAL ENTRY)
Bilirubin, UA: NEGATIVE
Glucose, UA: NEGATIVE mg/dL
Ketones, POC UA: NEGATIVE mg/dL
Leukocytes, UA: NEGATIVE
Nitrite, UA: NEGATIVE
Protein Ur, POC: 100 mg/dL — AB
Spec Grav, UA: 1.025 (ref 1.010–1.025)
Urobilinogen, UA: 0.2 E.U./dL
pH, UA: 6 (ref 5.0–8.0)

## 2023-05-15 MED ORDER — AMLODIPINE BESYLATE 10 MG PO TABS: 10.0000 mg | ORAL_TABLET | Freq: Every day | ORAL | 0 refills | Status: DC

## 2023-05-15 NOTE — Discharge Instructions (Addendum)
Your blood pressure was significantly elevated today despite being on your stool medication therapy.  I am starting you on amlodipine 10 mg, you can start this tonight before bed.   Please buy a wrist blood pressure cuff and take your blood pressure within 10 minutes of waking up and within 10 minutes of going to bed.  Please record the a.m. and p.m. readings and bring them to next provider visit.  Due to the extent of your hypertension, and not being able to get into your primary care until the end of August, please return to clinic in the next week or so so we can recheck your blood pressure and your overall status.  Please return to clinic or seek immediate care if you develop sudden onset of a bad headache, loss of vision, or any new concerning symptoms.
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
## 2023-05-15 NOTE — ED Triage Notes (Signed)
Pt states when to the eye doctor on Tuesday and his b/p was high. States compliant with b/p meds. Denies any pain. States having eye surgery 6/28.

## 2023-06-01 ENCOUNTER — Ambulatory Visit: Payer: Commercial Managed Care - PPO | Admitting: Family Medicine

## 2023-07-11 ENCOUNTER — Encounter (HOSPITAL_COMMUNITY): Payer: Self-pay

## 2023-07-11 ENCOUNTER — Ambulatory Visit (HOSPITAL_COMMUNITY)
Admission: EM | Admit: 2023-07-11 | Discharge: 2023-07-11 | Disposition: A | Discharge status: Home / Self Care | Payer: Commercial Managed Care - PPO | Attending: Family Medicine | Admitting: Family Medicine

## 2023-07-11 DIAGNOSIS — Z760 Encounter for issue of repeat prescription: Secondary | ICD-10-CM | POA: Insufficient documentation

## 2023-07-11 DIAGNOSIS — I10 Essential (primary) hypertension: Secondary | ICD-10-CM | POA: Diagnosis not present

## 2023-07-11 LAB — BASIC METABOLIC PANEL
Anion gap: 9 (ref 5–15)
BUN: 13 mg/dL (ref 6–20)
CO2: 26 mmol/L (ref 22–32)
Calcium: 9 mg/dL (ref 8.9–10.3)
Chloride: 104 mmol/L (ref 98–111)
Creatinine, Ser: 1.32 mg/dL — ABNORMAL HIGH (ref 0.61–1.24)
GFR, Estimated: >60 mL/min (ref >60)
Glucose, Bld: 203 mg/dL — ABNORMAL HIGH (ref 70–99)
Potassium: 4.5 mmol/L (ref 3.5–5.1)
Sodium: 139 mmol/L (ref 135–145)

## 2023-07-11 MED ORDER — AMLODIPINE BESYLATE 10 MG PO TABS: 10.0000 mg | ORAL_TABLET | Freq: Every day | ORAL | 0 refills | Status: DC

## 2023-07-11 MED ORDER — LOSARTAN POTASSIUM-HCTZ 50-12.5 MG PO TABS: 1.0000 | ORAL_TABLET | Freq: Every day | ORAL | 0 refills | Status: DC

## 2023-07-11 NOTE — ED Triage Notes (Signed)
Patient reports that he needs a refill on Losartan/hydrochlorothiazide 50/12.5 mg amlodipine 10 mg.

## 2023-07-11 NOTE — Discharge Instructions (Addendum)
You have had labs (blood test) sent today. We will call you with any significant abnormalities or if there is need to begin or change treatment or pursue further follow up.  You may also review your test results online through MyChart. If you do not have a MyChart account, instructions to sign up should be on your discharge paperwork.  
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
## 2023-07-28 ENCOUNTER — Ambulatory Visit (INDEPENDENT_AMBULATORY_CARE_PROVIDER_SITE_OTHER): Payer: Commercial Managed Care - PPO | Admitting: Family Medicine

## 2023-07-28 ENCOUNTER — Encounter: Payer: Self-pay | Admitting: Family Medicine

## 2023-07-28 VITALS — BP 160/90 | HR 92 | Temp 97.4°F | Resp 16 | Ht 71.0 in | Wt 176.2 lb

## 2023-07-28 DIAGNOSIS — Z7689 Persons encountering health services in other specified circumstances: Secondary | ICD-10-CM | POA: Diagnosis not present

## 2023-07-28 DIAGNOSIS — I10 Essential (primary) hypertension: Secondary | ICD-10-CM | POA: Diagnosis not present

## 2023-07-28 DIAGNOSIS — E785 Hyperlipidemia, unspecified: Secondary | ICD-10-CM

## 2023-07-28 MED ORDER — AMLODIPINE BESYLATE 10 MG PO TABS
10.0000 mg | ORAL_TABLET | Freq: Every day | ORAL | 0 refills | Status: DC
Start: 2023-07-28 — End: 2024-04-27

## 2023-07-28 MED ORDER — LOSARTAN POTASSIUM-HCTZ 50-12.5 MG PO TABS: 1.0000 | ORAL_TABLET | Freq: Every day | ORAL | 0 refills | Status: DC

## 2023-07-28 NOTE — Progress Notes (Unsigned)
Patient is here to established care with provider. ~health hx address ~care gaps address  

## 2023-07-29 ENCOUNTER — Encounter: Payer: Self-pay | Admitting: Family Medicine
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
## 2023-08-25 ENCOUNTER — Encounter: Payer: Commercial Managed Care - PPO | Admitting: Family Medicine

## 2023-10-06 ENCOUNTER — Encounter: Payer: Commercial Managed Care - PPO | Admitting: Family Medicine

## 2023-11-11 ENCOUNTER — Encounter: Payer: Self-pay | Admitting: Family Medicine

## 2023-11-11 ENCOUNTER — Ambulatory Visit (INDEPENDENT_AMBULATORY_CARE_PROVIDER_SITE_OTHER): Payer: Self-pay | Admitting: Family Medicine

## 2023-11-11 VITALS — BP 138/87 | HR 88 | Temp 98.1°F | Resp 16 | Ht 71.0 in | Wt 185.0 lb

## 2023-11-11 DIAGNOSIS — Z130 Encounter for screening for diseases of the blood and blood-forming organs and certain disorders involving the immune mechanism: Secondary | ICD-10-CM

## 2023-11-11 DIAGNOSIS — Z13220 Encounter for screening for lipoid disorders: Secondary | ICD-10-CM

## 2023-11-11 DIAGNOSIS — Z0000 Encounter for general adult medical examination without abnormal findings: Secondary | ICD-10-CM

## 2023-11-12 ENCOUNTER — Other Ambulatory Visit: Payer: Self-pay | Admitting: Family Medicine

## 2023-11-12 ENCOUNTER — Encounter: Payer: Self-pay | Admitting: Family Medicine

## 2023-11-12 LAB — CBC WITH DIFFERENTIAL/PLATELET
Basophils Absolute: 0.1 10*3/uL (ref 0.0–0.2)
Basos: 1 %
EOS (ABSOLUTE): 0.3 10*3/uL (ref 0.0–0.4)
Eos: 5 %
Hematocrit: 38.7 % (ref 37.5–51.0)
Hemoglobin: 12.8 g/dL — ABNORMAL LOW (ref 13.0–17.7)
Immature Grans (Abs): 0 10*3/uL (ref 0.0–0.1)
Immature Granulocytes: 0 %
Lymphocytes Absolute: 2.3 10*3/uL (ref 0.7–3.1)
Lymphs: 37 %
MCH: 32.6 pg (ref 26.6–33.0)
MCHC: 33.1 g/dL (ref 31.5–35.7)
MCV: 99 fL — ABNORMAL HIGH (ref 79–97)
Monocytes Absolute: 0.6 10*3/uL (ref 0.1–0.9)
Monocytes: 9 %
Neutrophils Absolute: 2.9 10*3/uL (ref 1.4–7.0)
Neutrophils: 48 %
Platelets: 269 10*3/uL (ref 150–450)
RBC: 3.93 x10E6/uL — ABNORMAL LOW (ref 4.14–5.80)
RDW: 11.8 % (ref 11.6–15.4)
WBC: 6.2 10*3/uL (ref 3.4–10.8)

## 2023-11-12 LAB — LIPID PANEL
Chol/HDL Ratio: 2.6 {ratio} (ref 0.0–5.0)
Cholesterol, Total: 146 mg/dL (ref 100–199)
HDL: 57 mg/dL (ref >39)
LDL Chol Calc (NIH): 77 mg/dL (ref 0–99)
Triglycerides: 54 mg/dL (ref 0–149)
VLDL Cholesterol Cal: 12 mg/dL (ref 5–40)

## 2023-11-12 LAB — CMP14+EGFR
ALT: 15 [IU]/L (ref 0–44)
AST: 21 [IU]/L (ref 0–40)
Albumin: 4.3 g/dL (ref 4.1–5.1)
Alkaline Phosphatase: 102 [IU]/L (ref 44–121)
BUN/Creatinine Ratio: 19 (ref 9–20)
BUN: 30 mg/dL — ABNORMAL HIGH (ref 6–20)
Bilirubin Total: 0.6 mg/dL (ref 0.0–1.2)
CO2: 21 mmol/L (ref 20–29)
Calcium: 9.3 mg/dL (ref 8.7–10.2)
Chloride: 106 mmol/L (ref 96–106)
Creatinine, Ser: 1.6 mg/dL — ABNORMAL HIGH (ref 0.76–1.27)
Globulin, Total: 2.3 g/dL (ref 1.5–4.5)
Glucose: 168 mg/dL — ABNORMAL HIGH (ref 70–99)
Potassium: 5.7 mmol/L — ABNORMAL HIGH (ref 3.5–5.2)
Sodium: 140 mmol/L (ref 134–144)
Total Protein: 6.6 g/dL (ref 6.0–8.5)
eGFR: 56 mL/min/{1.73_m2} — ABNORMAL LOW (ref >59)

## 2023-11-12 NOTE — Telephone Encounter (Signed)
Requested Prescriptions  Pending Prescriptions Disp Refills   losartan-hydrochlorothiazide (HYZAAR) 50-12.5 MG tablet [Pharmacy Med Name: LOSARTAN/HCTZ 50/12.5MG  TABLETS] 90 tablet 0    Sig: TAKE 1 TABLET BY MOUTH DAILY     Cardiovascular: ARB + Diuretic Combos Failed - 11/12/2023 11:39 AM      Failed - K in normal range and within 180 days    Potassium  Date Value Ref Range Status  11/11/2023 5.7 (H) 3.5 - 5.2 mmol/L Final         Failed - Cr in normal range and within 180 days    Creat  Date Value Ref Range Status  01/23/2022 1.39 (H) 0.60 - 1.26 mg/dL Final   Creatinine, Ser  Date Value Ref Range Status  11/11/2023 1.60 (H) 0.76 - 1.27 mg/dL Final   Creatinine, Urine  Date Value Ref Range Status  02/07/2008 263.3  Final         Passed - Na in normal range and within 180 days    Sodium  Date Value Ref Range Status  11/11/2023 140 134 - 144 mmol/L Final         Passed - eGFR is 10 or above and within 180 days    GFR calc Af Amer  Date Value Ref Range Status  01/14/2020 >60 >60 mL/min Final   GFR, Estimated  Date Value Ref Range Status  07/11/2023 >60 >60 mL/min Final    Comment:    (NOTE) Calculated using the CKD-EPI Creatinine Equation (2021)    eGFR  Date Value Ref Range Status  11/11/2023 56 (L) >59 mL/min/1.73 Final         Passed - Patient is not pregnant      Passed - Last BP in normal range    BP Readings from Last 1 Encounters:  11/11/23 138/87         Passed - Valid encounter within last 6 months    Recent Outpatient Visits           Yesterday Annual physical exam   [facility], MD   3 months ago Essential hypertension   [facility], MD       Future Appointments             In 1 month Georganna Skeans, MD [facility]
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
## 2023-12-15 ENCOUNTER — Encounter: Payer: Self-pay | Admitting: Family Medicine

## 2023-12-17 NOTE — Progress Notes (Signed)
This patient was a no show

## 2024-04-27 ENCOUNTER — Encounter (HOSPITAL_COMMUNITY): Payer: Self-pay

## 2024-04-27 ENCOUNTER — Ambulatory Visit (HOSPITAL_COMMUNITY)
Admission: EM | Admit: 2024-04-27 | Discharge: 2024-04-27 | Disposition: A | Discharge status: Home / Self Care | Payer: Self-pay | Attending: Family Medicine | Admitting: Family Medicine

## 2024-04-27 DIAGNOSIS — E1165 Type 2 diabetes mellitus with hyperglycemia: Secondary | ICD-10-CM

## 2024-04-27 DIAGNOSIS — Z7984 Long term (current) use of oral hypoglycemic drugs: Secondary | ICD-10-CM

## 2024-04-27 DIAGNOSIS — I10 Essential (primary) hypertension: Secondary | ICD-10-CM

## 2024-04-27 LAB — POCT FASTING CBG KUC MANUAL ENTRY: POCT Glucose (KUC): 194 mg/dL — AB (ref 70–99)

## 2024-04-27 MED ORDER — GLIPIZIDE ER 5 MG PO TB24: 5.0000 mg | ORAL_TABLET | Freq: Every day | ORAL | 2 refills | Status: AC

## 2024-04-27 NOTE — ED Triage Notes (Signed)
 Patient here today with c/o headache and blurry vision X 2 days. The headache comes and goes but the blurry vision is constant.

## 2024-04-27 NOTE — Discharge Instructions (Addendum)
 Labs Reviewed  POCT FASTING CBG [facility] MANUAL ENTRY - Abnormal; Notable for the following components:      Result Value   POCT Glucose ([facility]) 194 (*)    All other components within normal limits   Your blood pressure was noted to be elevated during your visit today. If you are currently taking medication for high blood pressure, please ensure you are taking this as directed. If you do not have a history of high blood pressure and your blood pressure remains persistently elevated, you may need to begin taking a medication at some point. You may return here within the next few days to recheck if unable to see your primary care provider or if you do not have a one.  BP (!) 153/108 (BP Location: Left Arm)   Pulse 90   Temp 98.6 F (37 C) (Oral)   Resp 16   SpO2 97%   BP Readings from Last 3 Encounters:  04/27/24 (!) 153/108  11/11/23 138/87  07/28/23 (!) 160/90
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
## 2024-05-11 ENCOUNTER — Telehealth: Payer: Self-pay | Admitting: Family Medicine

## 2024-05-11 ENCOUNTER — Ambulatory Visit: Payer: Self-pay | Admitting: Physician Assistant

## 2024-05-11 DIAGNOSIS — R7303 Prediabetes: Secondary | ICD-10-CM

## 2024-05-11 DIAGNOSIS — I10 Essential (primary) hypertension: Secondary | ICD-10-CM

## 2024-05-11 NOTE — Telephone Encounter (Signed)
 Called patient due to missed appointmnet, patient had death in the family this am that caused him to miss appointment patient is rescheduled and would like to see if he can get a courtesy refill until he is seen for his medications.

## 2024-05-12 ENCOUNTER — Other Ambulatory Visit: Payer: Self-pay | Admitting: Family Medicine

## 2024-05-12 ENCOUNTER — Ambulatory Visit: Payer: Self-pay | Admitting: Family Medicine

## 2024-05-12 MED ORDER — LOSARTAN POTASSIUM-HCTZ 50-12.5 MG PO TABS
1.0000 | ORAL_TABLET | Freq: Every day | ORAL | 0 refills | Status: DC
Start: 2024-05-12 — End: 2024-05-25

## 2024-05-16 ENCOUNTER — Emergency Department (HOSPITAL_COMMUNITY)
Admission: EM | Admit: 2024-05-16 | Discharge: 2024-05-16 | Disposition: A | Discharge status: Home / Self Care | Payer: PRIVATE HEALTH INSURANCE | Attending: Emergency Medicine | Admitting: Emergency Medicine

## 2024-05-16 ENCOUNTER — Ambulatory Visit (HOSPITAL_COMMUNITY)
Admission: EM | Admit: 2024-05-16 | Discharge: 2024-05-16 | Disposition: A | Discharge status: Home / Self Care | Payer: PRIVATE HEALTH INSURANCE | Attending: Emergency Medicine | Admitting: Emergency Medicine

## 2024-05-16 ENCOUNTER — Encounter (HOSPITAL_COMMUNITY): Payer: Self-pay | Admitting: Emergency Medicine

## 2024-05-16 ENCOUNTER — Encounter (HOSPITAL_COMMUNITY): Payer: Self-pay

## 2024-05-16 ENCOUNTER — Other Ambulatory Visit: Payer: Self-pay

## 2024-05-16 ENCOUNTER — Emergency Department (HOSPITAL_COMMUNITY): Payer: PRIVATE HEALTH INSURANCE

## 2024-05-16 DIAGNOSIS — R519 Headache, unspecified: Secondary | ICD-10-CM

## 2024-05-16 DIAGNOSIS — F17210 Nicotine dependence, cigarettes, uncomplicated: Secondary | ICD-10-CM | POA: Insufficient documentation

## 2024-05-16 DIAGNOSIS — E11319 Type 2 diabetes mellitus with unspecified diabetic retinopathy without macular edema: Secondary | ICD-10-CM | POA: Diagnosis not present

## 2024-05-16 DIAGNOSIS — H538 Other visual disturbances: Secondary | ICD-10-CM | POA: Diagnosis present

## 2024-05-16 DIAGNOSIS — I160 Hypertensive urgency: Secondary | ICD-10-CM

## 2024-05-16 DIAGNOSIS — I10 Essential (primary) hypertension: Secondary | ICD-10-CM | POA: Insufficient documentation

## 2024-05-16 LAB — COMPREHENSIVE METABOLIC PANEL WITH GFR
ALT: 22 U/L (ref 0–44)
AST: 22 U/L (ref 15–41)
Albumin: 3.8 g/dL (ref 3.5–5.0)
Alkaline Phosphatase: 63 U/L (ref 38–126)
Anion gap: 7 (ref 5–15)
BUN: 24 mg/dL — ABNORMAL HIGH (ref 6–20)
CO2: 24 mmol/L (ref 22–32)
Calcium: 9.3 mg/dL (ref 8.9–10.3)
Chloride: 109 mmol/L (ref 98–111)
Creatinine, Ser: 1.73 mg/dL — ABNORMAL HIGH (ref 0.61–1.24)
GFR, Estimated: 51 mL/min — ABNORMAL LOW (ref >60)
Glucose, Bld: 110 mg/dL — ABNORMAL HIGH (ref 70–99)
Potassium: 4.3 mmol/L (ref 3.5–5.1)
Sodium: 140 mmol/L (ref 135–145)
Total Bilirubin: 1.4 mg/dL — ABNORMAL HIGH (ref 0.0–1.2)
Total Protein: 6.8 g/dL (ref 6.5–8.1)

## 2024-05-16 LAB — URINALYSIS, ROUTINE W REFLEX MICROSCOPIC
Bacteria, UA: NONE SEEN
Bilirubin Urine: NEGATIVE
Glucose, UA: NEGATIVE mg/dL
Ketones, ur: NEGATIVE mg/dL
Leukocytes,Ua: NEGATIVE
Nitrite: NEGATIVE
Protein, ur: 100 mg/dL — AB
Specific Gravity, Urine: 1.01 (ref 1.005–1.030)
pH: 5 (ref 5.0–8.0)

## 2024-05-16 LAB — CBC WITH DIFFERENTIAL/PLATELET
Abs Immature Granulocytes: 0.01 10*3/uL (ref 0.00–0.07)
Basophils Absolute: 0.1 10*3/uL (ref 0.0–0.1)
Basophils Relative: 1 %
Eosinophils Absolute: 0.2 10*3/uL (ref 0.0–0.5)
Eosinophils Relative: 4 %
HCT: 35.6 % — ABNORMAL LOW (ref 39.0–52.0)
Hemoglobin: 11.9 g/dL — ABNORMAL LOW (ref 13.0–17.0)
Immature Granulocytes: 0 %
Lymphocytes Relative: 33 %
Lymphs Abs: 1.9 10*3/uL (ref 0.7–4.0)
MCH: 31.3 pg (ref 26.0–34.0)
MCHC: 33.4 g/dL (ref 30.0–36.0)
MCV: 93.7 fL (ref 80.0–100.0)
Monocytes Absolute: 0.5 10*3/uL (ref 0.1–1.0)
Monocytes Relative: 9 %
Neutro Abs: 3 10*3/uL (ref 1.7–7.7)
Neutrophils Relative %: 53 %
Platelets: 265 10*3/uL (ref 150–400)
RBC: 3.8 MIL/uL — ABNORMAL LOW (ref 4.22–5.81)
RDW: 12 % (ref 11.5–15.5)
WBC: 5.6 10*3/uL (ref 4.0–10.5)
nRBC: 0 % (ref 0.0–0.2)

## 2024-05-16 LAB — POCT FASTING CBG KUC MANUAL ENTRY: POCT Glucose (KUC): 120 mg/dL — AB (ref 70–99)
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
## 2024-05-16 NOTE — ED Triage Notes (Signed)
 Pt to ED from UC c/o HTN , reports hx of the same and compliant with medication regimen. Also reports blurred vision this morning around 5am, that is improving.

## 2024-05-16 NOTE — ED Notes (Signed)
 Patient talking on phone. Informed patient that would be back when he was off the phone.

## 2024-05-16 NOTE — ED Provider Triage Note (Signed)
 Emergency Medicine Provider Triage Evaluation Note  Arthur Meadows , a 38 y.o. male  was evaluated in triage.  Pt complains of Htn and blurred vision since this morning.  Review of Systems  Positive: Blurred vision, HTN med compliant, HA Negative: Numbness, weakness, CP, SHOB, N/V/D  Physical Exam  BP (!) 176/126   Pulse 95   Temp 97.6 F (36.4 C)   Resp 16   SpO2 97%  Gen:   Awake, no distress   Resp:  Normal effort  MSK:   Moves extremities without difficulty  Other:  Vision grossly intact, no facial droop  Medical Decision Making  Medically screening exam initiated at 2:20 PM.  Appropriate orders placed.  Arthur Meadows was informed that the remainder of the evaluation will be completed by another provider, this initial triage assessment does not replace that evaluation, and the importance of remaining in the ED until their evaluation is complete.  Labs and imaging ordered   Arthur Meadows 05/16/24 1422

## 2024-05-16 NOTE — Discharge Instructions (Signed)
 Please go to the emergency department for further evaluation of your bad headache, unilateral blurred vision, and high blood pressure.

## 2024-05-16 NOTE — ED Provider Notes (Signed)
 [place] [facility] Provider Note   CSN: [id number] Arrival date & time: 05/16/24  1327     History Chief Complaint  Patient presents with   Hypertension    Arthur Meadows is a 38 y.o. male w/ PMHx hypertension, type 2 diabetes, diabetic retinopathy, right eye retinal attachment who presents to the ED for evaluation of hypertension.  Patient seen in urgent care earlier today for hypertension was advised to come to the ED for blurred vision.  Patient states he has intermittent blurred vision in left eye due to diabetic retinopathy.  He states the symptoms are worse when he smokes cigarettes and has high sugar foods.  Patient states he has done both these over the past few days.  Patient states patient is gradually improved throughout the day and is now similar to baseline.  He denies any headache chest pain shortness of breath leg swelling.  Patient states he is in the process of scheduling appointment with ophthalmology.  Patient denies any eye pain, flashes, floaters.  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    Physical Exam Updated Vital Signs BP (!) 166/98 (BP Location: Left Arm)   Pulse 74   Temp (!) 97.2 F (36.2 C) (Temporal)   Resp 16   Ht 5' 11 (1.803 m)   Wt 86.2 kg   SpO2 99%   BMI 26.50 kg/m  Physical Exam Vitals and nursing note reviewed.  Constitutional:      General: He is not in acute distress.    Appearance: He is well-developed.  HENT:     Head: Normocephalic and atraumatic.   Eyes:     Extraocular Movements: Extraocular movements intact.     Conjunctiva/sclera: Conjunctivae normal.     Pupils: Pupils are equal, round, and reactive to light.     Comments: Patient legally blind in right eye this is baseline for him.  Vision grossly intact of left eye.   Cardiovascular:     Rate and Rhythm: Normal rate and regular rhythm.     Pulses: Normal pulses.     Heart sounds: Normal heart sounds. No  murmur heard. Pulmonary:     Effort: Pulmonary effort is normal. No respiratory distress.     Breath sounds: Normal breath sounds. No rales.   Musculoskeletal:     Cervical back: Neck supple.   Skin:    General: Skin is warm and dry.     Capillary Refill: Capillary refill takes less than 2 seconds.   Neurological:     General: No focal deficit present.     Mental Status: He is alert.   Psychiatric:        Mood and Affect: Mood normal.     ED Results / Procedures / Treatments   Labs (all labs ordered are listed, but only abnormal results are displayed) Labs Reviewed  COMPREHENSIVE METABOLIC PANEL WITH GFR - Abnormal; Notable for the following components:      Result Value   Glucose, Bld 110 (*)    BUN 24 (*)    Creatinine, Ser 1.73 (*)    Total Bilirubin 1.4 (*)    GFR, Estimated 51 (*)    All other components within normal limits  CBC WITH DIFFERENTIAL/PLATELET - Abnormal; Notable for the following components:   RBC 3.80 (*)    Hemoglobin 11.9 (*)    HCT 35.6 (*)    All other components within normal limits  URINALYSIS, ROUTINE  W REFLEX MICROSCOPIC - Abnormal; Notable for the following components:   Hgb urine dipstick MODERATE (*)    Protein, ur 100 (*)    All other components within normal limits    EKG EKG Interpretation Date/Time:  Monday May 16 2024 15:18:16 EDT Ventricular Rate:  88 PR Interval:  150 QRS Duration:  100 QT Interval:  360 QTC Calculation: 435 R Axis:   77  Text Interpretation: Normal sinus rhythm Minimal voltage criteria for LVH, may be normal variant ( Cornell product ) Borderline ECG When compared with ECG of 14-Jan-2020 22:40, PREVIOUS ECG IS PRESENT T wave inversion no longer seen in lateral precordial leads LVH persists Confirmed by Early Glisson ([id number]) on 05/16/2024 9:43:51 PM  Radiology CT Head Wo Contrast Result Date: 05/16/2024 CLINICAL DATA:  Headache.  Neuro deficit EXAM: CT HEAD WITHOUT CONTRAST TECHNIQUE: Contiguous axial  images were obtained from the base of the skull through the vertex without intravenous contrast. RADIATION DOSE REDUCTION: This exam was performed according to the departmental dose-optimization program which includes automated exposure control, adjustment of the mA and/or kV according to patient size and/or use of iterative reconstruction technique. COMPARISON:  None Available. FINDINGS: Brain: No acute intracranial hemorrhage. No focal mass lesion. No CT evidence of acute infarction. No midline shift or mass effect. No hydrocephalus. Basilar cisterns are patent. Vascular: No hyperdense vessel or unexpected calcification. Skull: Normal. Negative for fracture or focal lesion. Sinuses/Orbits: Paranasal sinuses and mastoid air cells are clear. Orbits are clear. Other: None. IMPRESSION: No acute intracranial findings. Electronically Signed   By: Deboraha Fallow M.D.   On: 05/16/2024 16:37    Medications Ordered in ED Medications - No data to display  ED Course/ Medical Decision Making/ A&P  Arthur Meadows is a 38 y.o. male presents as detailed above  Differential ddx: Diabetic retinopathy, hypertension  On arrival, patient afebrile hemodynamically stable no hypoxia or respiratory distress.  Patient at baseline per patient.  ED Work-up: Please see details of labs and imaging listed above. In summary, no evidence of ophthalmologic emergency, hypertensive urgency or emergency.  Patient's blood pressure at baseline in ED.  Patient states he is in the process of scheduling appointment with ophthalmology.  Patient able to follow-up with PCP in the next few days.  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 Overall no concern for acute life-threatening illness or injury.  Patient stable for discharge outpatient follow-up.  Provided with local ophthalmology clinic but as stated above patient already in process with scheduling his appointment.    Overall  impression uncontrolled hypertension, diabetic retinopathy Patient stable for discharge and outpatient follow-up. Strict return precautions provided. Patient voices understanding and agrees with plan.   Patient seen with supervising physician who agrees with plan.  Final Clinical Impression(s) / ED Diagnoses Final diagnoses:  Uncontrolled hypertension    Angele Keller, DO PGY-3 Emergency Medicine    Angele Keller, DO 05/16/24 2238    Early Glisson, MD 05/17/24 [phone]

## 2024-05-16 NOTE — Discharge Instructions (Signed)
 Please follow-up with urology as soon as possible.  A local clinic is listed if you would like to call to schedule an appointment.  Please refrain from high sugar foods tobacco/nicotine and alcohol. Thank you for allowing us  to take care of you today.  We hope you begin feeling better soon.   To-Do:  Please follow-up with your primary doctor within the next 2-3 days. Please return to the Emergency Department or call 911 if you experience chest pain, shortness of breath, severe pain, severe fever, altered mental status, or have any reason to think that you need emergency medical care.  Thank you again.  Hope you feel better soon.  [facility] Department of Emergency Medicine

## 2024-05-16 NOTE — ED Notes (Signed)
 Patient is being discharged from the Urgent Care and sent to the Emergency Department via POV. Per Levora Reas, NP, patient is in need of higher level of care due to blurred vison after headache and hypertension. Patient is aware and verbalizes understanding of plan of care.  Vitals:   05/16/24 1249  BP: (!) 194/126  Pulse: 96  Resp: 17  Temp: 98.2 F (36.8 C)  SpO2: 96%

## 2024-05-16 NOTE — ED Triage Notes (Signed)
 Pt reports when he was working this morning had blurred vision and had to leave so needs a note for. Reports ate some M&Ms and smoking.

## 2024-05-25 ENCOUNTER — Encounter: Payer: Self-pay | Admitting: Family Medicine

## 2024-05-25 ENCOUNTER — Ambulatory Visit (INDEPENDENT_AMBULATORY_CARE_PROVIDER_SITE_OTHER): Payer: PRIVATE HEALTH INSURANCE | Admitting: Family Medicine

## 2024-05-25 VITALS — BP 132/92 | HR 91 | Wt 184.4 lb

## 2024-05-25 DIAGNOSIS — E785 Hyperlipidemia, unspecified: Secondary | ICD-10-CM | POA: Diagnosis not present

## 2024-05-25 DIAGNOSIS — I10 Essential (primary) hypertension: Secondary | ICD-10-CM | POA: Diagnosis not present

## 2024-05-25 MED ORDER — LOSARTAN POTASSIUM-HCTZ 50-12.5 MG PO TABS: 1.0000 | ORAL_TABLET | Freq: Every day | ORAL | 0 refills | Status: DC

## 2024-05-26 ENCOUNTER — Encounter: Payer: Self-pay | Admitting: Family Medicine
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
## 2024-10-19 ENCOUNTER — Other Ambulatory Visit: Payer: Self-pay

## 2024-10-19 NOTE — Telephone Encounter (Signed)
 Attempted to call pt back. No answer. LVM for call back

## 2024-10-19 NOTE — Addendum Note (Signed)
 Addended by: Eleisha Branscomb E on: 10/19/2024 03:42 PM   Modules accepted: Orders

## 2024-10-19 NOTE — Telephone Encounter (Signed)
 Copied from CRM #[id number]. Topic: Clinical - Medication Question >> Oct 19, 2024  9:15 AM Tonda B wrote: Reason for CRM: patient has questions about his rx  losartan -hydrochlorothiazide  (HYZAAR) 50-12.5 MG tablet please call pt back [phone]

## 2024-10-24 MED ORDER — LOSARTAN POTASSIUM-HCTZ 50-12.5 MG PO TABS: 1.0000 | ORAL_TABLET | Freq: Every day | ORAL | 0 refills | Status: AC

## 2024-12-24 ENCOUNTER — Ambulatory Visit
Admission: EM | Admit: 2024-12-24 | Discharge: 2024-12-24 | Disposition: A | Discharge status: Home / Self Care | Payer: PRIVATE HEALTH INSURANCE

## 2024-12-24 DIAGNOSIS — H43392 Other vitreous opacities, left eye: Secondary | ICD-10-CM | POA: Diagnosis not present

## 2024-12-24 DIAGNOSIS — H5462 Unqualified visual loss, left eye, normal vision right eye: Secondary | ICD-10-CM

## 2024-12-24 NOTE — ED Triage Notes (Addendum)
 Arthur Meadows presents with blurred vision x day 6, started at work on Monday night. Reports floaters. No treatment used. He states he need a doctors note.

## 2024-12-24 NOTE — Discharge Instructions (Signed)
 Keep appt with eye doctor on Thursday.  If you experience eye pain or sensitivity to light or complete loss of vision we need to report to the ED
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
